# Patient Record
Sex: Female | Born: 1965 | Race: White | Hispanic: No | Marital: Married | Smoking: Former smoker
Health system: Southern US, Community
[De-identification: ages and names within clinical notes are randomized; demographics above are authoritative.]

## PROBLEM LIST (undated history)

## (undated) DIAGNOSIS — E785 Hyperlipidemia, unspecified: Secondary | ICD-10-CM

## (undated) DIAGNOSIS — N809 Endometriosis, unspecified: Secondary | ICD-10-CM

## (undated) DIAGNOSIS — Z8489 Family history of other specified conditions: Secondary | ICD-10-CM

## (undated) DIAGNOSIS — G4730 Sleep apnea, unspecified: Secondary | ICD-10-CM

## (undated) DIAGNOSIS — I480 Paroxysmal atrial fibrillation: Secondary | ICD-10-CM

## (undated) DIAGNOSIS — R7303 Prediabetes: Secondary | ICD-10-CM

## (undated) HISTORY — DX: Sleep apnea, unspecified: G47.30

## (undated) HISTORY — PX: EXPLORATORY LAPAROTOMY: SUR591

## (undated) HISTORY — PX: ACHILLES TENDON SURGERY: SHX542

## (undated) HISTORY — PX: APPENDECTOMY: SHX54

## (undated) HISTORY — PX: ROTATOR CUFF REPAIR: SHX139

## (undated) HISTORY — DX: Prediabetes: R73.03

## (undated) HISTORY — PX: CHOLECYSTECTOMY: SHX55

## (undated) HISTORY — DX: Hyperlipidemia, unspecified: E78.5

## (undated) HISTORY — DX: Paroxysmal atrial fibrillation: I48.0

## (undated) HISTORY — DX: Endometriosis, unspecified: N80.9

---

## 1989-01-27 LAB — HM HEPATITIS C SCREENING LAB: HM Hepatitis Screen: NEGATIVE

## 2014-09-06 LAB — RESULTS CONSOLE HPV: CHL HPV: NEGATIVE

## 2014-09-06 LAB — HM PAP SMEAR: HM Pap smear: NORMAL

## 2016-07-16 DIAGNOSIS — N6452 Nipple discharge: Secondary | ICD-10-CM | POA: Diagnosis not present

## 2016-10-24 DIAGNOSIS — N61 Mastitis without abscess: Secondary | ICD-10-CM | POA: Diagnosis not present

## 2016-11-03 DIAGNOSIS — N61 Mastitis without abscess: Secondary | ICD-10-CM | POA: Diagnosis not present

## 2016-12-23 DIAGNOSIS — Z85828 Personal history of other malignant neoplasm of skin: Secondary | ICD-10-CM | POA: Diagnosis not present

## 2016-12-23 DIAGNOSIS — L720 Epidermal cyst: Secondary | ICD-10-CM | POA: Diagnosis not present

## 2016-12-23 DIAGNOSIS — Z08 Encounter for follow-up examination after completed treatment for malignant neoplasm: Secondary | ICD-10-CM | POA: Diagnosis not present

## 2016-12-23 DIAGNOSIS — D485 Neoplasm of uncertain behavior of skin: Secondary | ICD-10-CM | POA: Diagnosis not present

## 2016-12-23 DIAGNOSIS — C44519 Basal cell carcinoma of skin of other part of trunk: Secondary | ICD-10-CM | POA: Diagnosis not present

## 2017-01-14 DIAGNOSIS — Z1231 Encounter for screening mammogram for malignant neoplasm of breast: Secondary | ICD-10-CM | POA: Diagnosis not present

## 2017-01-22 DIAGNOSIS — C44519 Basal cell carcinoma of skin of other part of trunk: Secondary | ICD-10-CM | POA: Diagnosis not present

## 2017-01-22 DIAGNOSIS — L910 Hypertrophic scar: Secondary | ICD-10-CM | POA: Diagnosis not present

## 2017-01-22 DIAGNOSIS — D485 Neoplasm of uncertain behavior of skin: Secondary | ICD-10-CM | POA: Diagnosis not present

## 2017-01-22 DIAGNOSIS — Z85828 Personal history of other malignant neoplasm of skin: Secondary | ICD-10-CM | POA: Diagnosis not present

## 2017-01-22 DIAGNOSIS — Z08 Encounter for follow-up examination after completed treatment for malignant neoplasm: Secondary | ICD-10-CM | POA: Diagnosis not present

## 2017-03-17 DIAGNOSIS — L988 Other specified disorders of the skin and subcutaneous tissue: Secondary | ICD-10-CM | POA: Diagnosis not present

## 2017-03-17 DIAGNOSIS — C44519 Basal cell carcinoma of skin of other part of trunk: Secondary | ICD-10-CM | POA: Diagnosis not present

## 2017-03-17 DIAGNOSIS — L905 Scar conditions and fibrosis of skin: Secondary | ICD-10-CM | POA: Diagnosis not present

## 2017-05-05 DIAGNOSIS — Z08 Encounter for follow-up examination after completed treatment for malignant neoplasm: Secondary | ICD-10-CM | POA: Diagnosis not present

## 2017-05-05 DIAGNOSIS — Z85828 Personal history of other malignant neoplasm of skin: Secondary | ICD-10-CM | POA: Diagnosis not present

## 2017-05-05 DIAGNOSIS — L570 Actinic keratosis: Secondary | ICD-10-CM | POA: Diagnosis not present

## 2017-07-20 DIAGNOSIS — Z13220 Encounter for screening for lipoid disorders: Secondary | ICD-10-CM | POA: Diagnosis not present

## 2017-07-20 DIAGNOSIS — Z0000 Encounter for general adult medical examination without abnormal findings: Secondary | ICD-10-CM | POA: Diagnosis not present

## 2017-11-10 DIAGNOSIS — R109 Unspecified abdominal pain: Secondary | ICD-10-CM | POA: Diagnosis not present

## 2017-12-22 DIAGNOSIS — Z08 Encounter for follow-up examination after completed treatment for malignant neoplasm: Secondary | ICD-10-CM | POA: Diagnosis not present

## 2017-12-22 DIAGNOSIS — Z85828 Personal history of other malignant neoplasm of skin: Secondary | ICD-10-CM | POA: Diagnosis not present

## 2018-01-21 DIAGNOSIS — Z1231 Encounter for screening mammogram for malignant neoplasm of breast: Secondary | ICD-10-CM | POA: Diagnosis not present

## 2018-10-05 DIAGNOSIS — Z08 Encounter for follow-up examination after completed treatment for malignant neoplasm: Secondary | ICD-10-CM | POA: Diagnosis not present

## 2018-10-05 DIAGNOSIS — Z85828 Personal history of other malignant neoplasm of skin: Secondary | ICD-10-CM | POA: Diagnosis not present

## 2018-10-05 DIAGNOSIS — C44519 Basal cell carcinoma of skin of other part of trunk: Secondary | ICD-10-CM | POA: Diagnosis not present

## 2018-10-05 DIAGNOSIS — D485 Neoplasm of uncertain behavior of skin: Secondary | ICD-10-CM | POA: Diagnosis not present

## 2018-11-02 DIAGNOSIS — C44519 Basal cell carcinoma of skin of other part of trunk: Secondary | ICD-10-CM | POA: Diagnosis not present

## 2019-04-05 DIAGNOSIS — D485 Neoplasm of uncertain behavior of skin: Secondary | ICD-10-CM | POA: Diagnosis not present

## 2019-04-05 DIAGNOSIS — L578 Other skin changes due to chronic exposure to nonionizing radiation: Secondary | ICD-10-CM | POA: Diagnosis not present

## 2019-04-05 DIAGNOSIS — L820 Inflamed seborrheic keratosis: Secondary | ICD-10-CM | POA: Diagnosis not present

## 2019-04-05 DIAGNOSIS — Z85828 Personal history of other malignant neoplasm of skin: Secondary | ICD-10-CM | POA: Diagnosis not present

## 2019-04-05 DIAGNOSIS — C44519 Basal cell carcinoma of skin of other part of trunk: Secondary | ICD-10-CM | POA: Diagnosis not present

## 2019-04-05 DIAGNOSIS — Z08 Encounter for follow-up examination after completed treatment for malignant neoplasm: Secondary | ICD-10-CM | POA: Diagnosis not present

## 2019-04-20 DIAGNOSIS — M25642 Stiffness of left hand, not elsewhere classified: Secondary | ICD-10-CM | POA: Diagnosis not present

## 2019-04-26 DIAGNOSIS — L905 Scar conditions and fibrosis of skin: Secondary | ICD-10-CM | POA: Diagnosis not present

## 2019-04-26 DIAGNOSIS — C44519 Basal cell carcinoma of skin of other part of trunk: Secondary | ICD-10-CM | POA: Diagnosis not present

## 2019-10-04 DIAGNOSIS — Z08 Encounter for follow-up examination after completed treatment for malignant neoplasm: Secondary | ICD-10-CM | POA: Diagnosis not present

## 2019-10-04 DIAGNOSIS — Z85828 Personal history of other malignant neoplasm of skin: Secondary | ICD-10-CM | POA: Diagnosis not present

## 2019-10-04 DIAGNOSIS — L219 Seborrheic dermatitis, unspecified: Secondary | ICD-10-CM | POA: Diagnosis not present

## 2019-10-04 DIAGNOSIS — L570 Actinic keratosis: Secondary | ICD-10-CM | POA: Diagnosis not present

## 2019-10-04 DIAGNOSIS — L578 Other skin changes due to chronic exposure to nonionizing radiation: Secondary | ICD-10-CM | POA: Diagnosis not present

## 2019-12-13 ENCOUNTER — Ambulatory Visit (INDEPENDENT_AMBULATORY_CARE_PROVIDER_SITE_OTHER): Payer: 59

## 2019-12-13 ENCOUNTER — Other Ambulatory Visit: Payer: Self-pay

## 2019-12-13 ENCOUNTER — Ambulatory Visit (HOSPITAL_COMMUNITY)
Admission: EM | Admit: 2019-12-13 | Discharge: 2019-12-13 | Disposition: A | Discharge status: Home / Self Care | Payer: 59 | Attending: Family Medicine | Admitting: Family Medicine

## 2019-12-13 ENCOUNTER — Encounter (HOSPITAL_COMMUNITY): Payer: Self-pay | Admitting: Emergency Medicine

## 2019-12-13 DIAGNOSIS — M79671 Pain in right foot: Secondary | ICD-10-CM

## 2019-12-13 DIAGNOSIS — S99921A Unspecified injury of right foot, initial encounter: Secondary | ICD-10-CM | POA: Diagnosis not present

## 2019-12-13 NOTE — ED Triage Notes (Signed)
patient reports a fall today.  Patient fell on step, twisted right foot.  Fell down 3 steps.  Left elbow pain, and left ankle abrasion.  Right foot with pain, swelling and bruise to right foot.  Right pedal pulse 2+. Patient is able to move toes on right foot.  Patient has an ice pack to right foot

## 2019-12-13 NOTE — Discharge Instructions (Addendum)
If not allergic, you may use over the counter ibuprofen or acetaminophen as needed. ° °

## 2019-12-14 NOTE — ED Provider Notes (Signed)
[place]   [id number] 12/13/19 Arrival Time: [place]:  1. Right foot pain     I have personally viewed the imaging studies ordered this visit. Questionable prox 5th metatarsal fx.  Orders Placed This Encounter  Procedures   DG Foot Complete Right   Apply CAM boot   WBAT.  Recommend:  Follow-up Information    Schedule an appointment as soon as possible for a visit  with [facility].   Why: [address] [place], [place] [address]  [phone]             Prefers OTC ibuprofen/Tylenol as needed.  Reviewed expectations re: course of current medical issues. Questions answered. Outlined signs and symptoms indicating need for more acute intervention. Patient verbalized understanding. After Visit Summary given.  SUBJECTIVE: History from: patient. Nicole Anderson is a 54 y.o. female who reports persistent marked pain of her right dorsal and lateral foot; described as aching; without radiation. Onset: abrupt. First noted: today. Injury/trama: reports falling on stairs with immediate pain to foot. Symptoms have progressed to a point and plateaued since beginning. Aggravating factors: certain movements and weight bearing. Alleviating factors: rest. Associated symptoms: none reported. Extremity sensation changes or weakness: none. Self treatment: none yet.  History of similar: no.  Past Surgical History:  Procedure Laterality Date   ACHILLES TENDON SURGERY Bilateral    APPENDECTOMY     CHOLECYSTECTOMY     ROTATOR CUFF REPAIR        OBJECTIVE:  Vitals:   12/13/19 1521  BP: (!) 144/69  Pulse: 66  Resp: (!) 21  Temp: 98.7 F (37.1 C)  TempSrc: Oral  SpO2: 95%    General appearance: alert; no distress HEENT: [place]; AT Neck: supple with FROM Resp: unlabored respirations Extremities:  RLE: warm with well perfused appearance; fairly well localized moderate tenderness over right dorsal and  lateral foot; is tender over prox 5th metatarsal; without gross deformities; swelling: moderate; bruising: minimal; ankle ROM: normal CV: brisk extremity capillary refill of RLE; 2+ DP pulse of RLE. Skin: warm and dry; no visible rashes Neurologic: normal sensation and strength of RLE; moves toes normally Psychological: alert and cooperative; normal mood and affect  Imaging: DG Foot Complete Right  Result Date: 12/13/2019 CLINICAL DATA:  Right foot pain after twisting injury. Second through fifth metatarsals. Dorsal pain. EXAM: RIGHT FOOT COMPLETE - 3+ VIEW COMPARISON:  None. FINDINGS: Irregular lucency at the base of the fifth metatarsal. Degenerative changes of the first metatarsophalangeal joint. IMPRESSION: Somewhat irregular lucency at the base of the fifth metatarsal. Given clinical history, presumably indicative of acute/subacute fracture. Correlate with point tenderness, as remote non healed fracture could look similar. Electronically Signed   By: Abigail Miyamoto M.D.   On: 12/13/2019 16:46      Allergies  Allergen Reactions   Ketorolac Tromethamine Rash   Nitrofurantoin Rash   Dilaudid [Hydromorphone]     History reviewed. No pertinent past medical history. Social History   Socioeconomic History   Marital status: Married    Spouse name: Not on file   Number of children: Not on file   Years of education: Not on file   Highest education level: Not on file  Occupational History   Not on file  Tobacco Use   Smoking status: Never Smoker  Substance and Sexual Activity   Alcohol use: Never   Drug use: Never   Sexual activity: Not on file  Other Topics Concern  Not on file  Social History Narrative   Not on file   Social Determinants of Health   Financial Resource Strain:    Difficulty of Paying Living Expenses: Not on file  Food Insecurity:    Worried About [place] in the Last Year: Not on file   Ran Out of Food in the Last Year: Not on  file  Transportation Needs:    Lack of Transportation (Medical): Not on file   Lack of Transportation (Non-Medical): Not on file  Physical Activity:    Days of Exercise per Week: Not on file   Minutes of Exercise per Session: Not on file  Stress:    Feeling of Stress : Not on file  Social Connections:    Frequency of Communication with Friends and Family: Not on file   Frequency of Social Gatherings with Friends and Family: Not on file   Attends Religious Services: Not on file   Active Member of Clubs or Organizations: Not on file   Attends Archivist Meetings: Not on file   Marital Status: Not on file   No family history on file. Past Surgical History:  Procedure Laterality Date   ACHILLES TENDON SURGERY Bilateral    APPENDECTOMY     CHOLECYSTECTOMY     ROTATOR CUFF REPAIR        Vanessa Kick, MD 12/14/19 [phone]

## 2020-02-15 ENCOUNTER — Other Ambulatory Visit: Payer: Self-pay | Admitting: Orthopaedic Surgery

## 2020-02-15 ENCOUNTER — Other Ambulatory Visit (HOSPITAL_COMMUNITY): Payer: Self-pay

## 2020-02-15 ENCOUNTER — Other Ambulatory Visit: Payer: Self-pay

## 2020-02-15 DIAGNOSIS — M79671 Pain in right foot: Secondary | ICD-10-CM

## 2020-02-22 ENCOUNTER — Other Ambulatory Visit: Payer: Self-pay

## 2020-02-22 ENCOUNTER — Ambulatory Visit (HOSPITAL_COMMUNITY)
Admission: RE | Admit: 2020-02-22 | Discharge: 2020-02-22 | Disposition: A | Discharge status: Home / Self Care | Payer: PRIVATE HEALTH INSURANCE | Source: Ambulatory Visit | Attending: Orthopaedic Surgery | Admitting: Orthopaedic Surgery

## 2020-02-22 DIAGNOSIS — M79671 Pain in right foot: Secondary | ICD-10-CM | POA: Insufficient documentation

## 2020-03-15 ENCOUNTER — Other Ambulatory Visit: Payer: Self-pay | Admitting: Orthopaedic Surgery

## 2020-03-22 ENCOUNTER — Encounter (HOSPITAL_BASED_OUTPATIENT_CLINIC_OR_DEPARTMENT_OTHER): Payer: Self-pay | Admitting: Orthopaedic Surgery

## 2020-03-22 ENCOUNTER — Other Ambulatory Visit: Payer: Self-pay

## 2020-03-26 ENCOUNTER — Other Ambulatory Visit (HOSPITAL_COMMUNITY)
Admission: RE | Admit: 2020-03-26 | Discharge: 2020-03-26 | Disposition: A | Discharge status: Home / Self Care | Payer: 59 | Source: Ambulatory Visit | Attending: Orthopaedic Surgery | Admitting: Orthopaedic Surgery

## 2020-03-26 DIAGNOSIS — Z20822 Contact with and (suspected) exposure to covid-19: Secondary | ICD-10-CM | POA: Insufficient documentation

## 2020-03-26 DIAGNOSIS — Z01812 Encounter for preprocedural laboratory examination: Secondary | ICD-10-CM | POA: Diagnosis not present

## 2020-03-26 NOTE — Progress Notes (Signed)

## 2020-03-27 LAB — SARS CORONAVIRUS 2 (TAT 6-24 HRS): SARS Coronavirus 2: NEGATIVE

## 2020-03-29 ENCOUNTER — Ambulatory Visit (HOSPITAL_BASED_OUTPATIENT_CLINIC_OR_DEPARTMENT_OTHER)
Admission: RE | Admit: 2020-03-29 | Discharge: 2020-03-29 | Disposition: A | Discharge status: Home / Self Care | Payer: PRIVATE HEALTH INSURANCE | Attending: Orthopaedic Surgery | Admitting: Orthopaedic Surgery

## 2020-03-29 ENCOUNTER — Ambulatory Visit (HOSPITAL_BASED_OUTPATIENT_CLINIC_OR_DEPARTMENT_OTHER): Payer: PRIVATE HEALTH INSURANCE | Admitting: Certified Registered"

## 2020-03-29 ENCOUNTER — Encounter (HOSPITAL_BASED_OUTPATIENT_CLINIC_OR_DEPARTMENT_OTHER): Payer: Self-pay | Admitting: Orthopaedic Surgery

## 2020-03-29 ENCOUNTER — Encounter (HOSPITAL_BASED_OUTPATIENT_CLINIC_OR_DEPARTMENT_OTHER)
Admission: RE | Disposition: A | Discharge status: Home / Self Care | Payer: Self-pay | Source: Home / Self Care | Attending: Orthopaedic Surgery

## 2020-03-29 ENCOUNTER — Other Ambulatory Visit: Payer: Self-pay

## 2020-03-29 DIAGNOSIS — M67873 Other specified disorders of tendon, right ankle and foot: Secondary | ICD-10-CM | POA: Insufficient documentation

## 2020-03-29 DIAGNOSIS — Z793 Long term (current) use of hormonal contraceptives: Secondary | ICD-10-CM | POA: Insufficient documentation

## 2020-03-29 DIAGNOSIS — X58XXXA Exposure to other specified factors, initial encounter: Secondary | ICD-10-CM | POA: Diagnosis not present

## 2020-03-29 DIAGNOSIS — S92351A Displaced fracture of fifth metatarsal bone, right foot, initial encounter for closed fracture: Secondary | ICD-10-CM | POA: Diagnosis not present

## 2020-03-29 DIAGNOSIS — Z87891 Personal history of nicotine dependence: Secondary | ICD-10-CM | POA: Diagnosis not present

## 2020-03-29 DIAGNOSIS — S92351K Displaced fracture of fifth metatarsal bone, right foot, subsequent encounter for fracture with nonunion: Secondary | ICD-10-CM | POA: Diagnosis present

## 2020-03-29 HISTORY — DX: Family history of other specified conditions: Z84.89

## 2020-03-29 HISTORY — PX: REPAIR EXTENSOR TENDON WITH METATARSAL OSTEOTOMY AND OPEN REDUCTION IN: SHX5698

## 2020-03-29 SURGERY — REPAIR EXTENSOR TENDON WITH METATARSAL OSTEOTOMY AND OPEN REDUCTION INTERNAL FIXATION (ORIF) METATARSAL
Anesthesia: General | Site: Foot | Laterality: Right

## 2020-03-29 MED ORDER — FENTANYL CITRATE (PF) 100 MCG/2ML IJ SOLN
INTRAMUSCULAR | Status: AC
  Filled 2020-03-29: qty 2

## 2020-03-29 MED ORDER — AMISULPRIDE (ANTIEMETIC) 5 MG/2ML IV SOLN: 10.0000 mg | Freq: Once | INTRAVENOUS | Status: DC | PRN

## 2020-03-29 MED ORDER — PROPOFOL 10 MG/ML IV BOLUS
INTRAVENOUS | Status: AC
  Filled 2020-03-29: qty 20

## 2020-03-29 MED ORDER — EPHEDRINE SULFATE 50 MG/ML IJ SOLN
INTRAMUSCULAR | Status: DC | PRN
  Administered 2020-03-29: 10 mg via INTRAVENOUS

## 2020-03-29 MED ORDER — MIDAZOLAM HCL 2 MG/2ML IJ SOLN
INTRAMUSCULAR | Status: AC
  Filled 2020-03-29: qty 2

## 2020-03-29 MED ORDER — ACETAMINOPHEN 500 MG PO TABS: 1000.0000 mg | ORAL_TABLET | Freq: Once | ORAL | Status: DC

## 2020-03-29 MED ORDER — CEFAZOLIN SODIUM-DEXTROSE 2-4 GM/100ML-% IV SOLN
INTRAVENOUS | Status: AC
  Filled 2020-03-29: qty 100

## 2020-03-29 MED ORDER — CEFAZOLIN SODIUM-DEXTROSE 1-4 GM/50ML-% IV SOLN
INTRAVENOUS | Status: AC
  Filled 2020-03-29: qty 50

## 2020-03-29 MED ORDER — PROPOFOL 10 MG/ML IV BOLUS
INTRAVENOUS | Status: DC | PRN
  Administered 2020-03-29: 100 mg via INTRAVENOUS
  Administered 2020-03-29: 200 mg via INTRAVENOUS

## 2020-03-29 MED ORDER — 0.9 % SODIUM CHLORIDE (POUR BTL) OPTIME
TOPICAL | Status: DC | PRN
  Administered 2020-03-29: 1000 mL

## 2020-03-29 MED ORDER — BUPIVACAINE-EPINEPHRINE (PF) 0.5% -1:200000 IJ SOLN
INTRAMUSCULAR | Status: DC | PRN
  Administered 2020-03-29: 30 mL via PERINEURAL

## 2020-03-29 MED ORDER — ONDANSETRON HCL 4 MG/2ML IJ SOLN
INTRAMUSCULAR | Status: DC | PRN
Start: 2020-03-29 — End: 2020-03-29
  Administered 2020-03-29: 4 mg via INTRAVENOUS

## 2020-03-29 MED ORDER — EPHEDRINE 5 MG/ML INJ
INTRAVENOUS | Status: AC
  Filled 2020-03-29: qty 10

## 2020-03-29 MED ORDER — CEFAZOLIN SODIUM-DEXTROSE 2-4 GM/100ML-% IV SOLN
2.0000 g | INTRAVENOUS | Status: AC
  Administered 2020-03-29: 3 g via INTRAVENOUS

## 2020-03-29 MED ORDER — MIDAZOLAM HCL 2 MG/2ML IJ SOLN
2.0000 mg | Freq: Once | INTRAMUSCULAR | Status: AC
  Administered 2020-03-29: 2 mg via INTRAVENOUS

## 2020-03-29 MED ORDER — PROPOFOL 500 MG/50ML IV EMUL
INTRAVENOUS | Status: AC
  Filled 2020-03-29: qty 50

## 2020-03-29 MED ORDER — FENTANYL CITRATE (PF) 100 MCG/2ML IJ SOLN: 25.0000 ug | INTRAMUSCULAR | Status: DC | PRN

## 2020-03-29 MED ORDER — FENTANYL CITRATE (PF) 100 MCG/2ML IJ SOLN
50.0000 ug | Freq: Once | INTRAMUSCULAR | Status: AC
Start: 2020-03-29 — End: 2020-03-29
  Administered 2020-03-29: 50 ug via INTRAVENOUS

## 2020-03-29 MED ORDER — LACTATED RINGERS IV SOLN: INTRAVENOUS | Status: DC

## 2020-03-29 MED ORDER — DEXAMETHASONE SODIUM PHOSPHATE 4 MG/ML IJ SOLN
INTRAMUSCULAR | Status: DC | PRN
  Administered 2020-03-29: 10 mg via INTRAVENOUS

## 2020-03-29 MED ORDER — OXYCODONE HCL 5 MG PO TABS: 5.0000 mg | ORAL_TABLET | ORAL | 0 refills | Status: AC | PRN

## 2020-03-29 SURGICAL SUPPLY — 72 items
BANDAGE ESMARK 6X9 LF (GAUZE/BANDAGES/DRESSINGS) IMPLANT
BENZOIN TINCTURE PRP APPL 2/3 (GAUZE/BANDAGES/DRESSINGS) IMPLANT
BIT DRILL 1.7 (BIT) ×1
BIT DRILL 1.7X (BIT) ×1 IMPLANT
BLADE SURG 15 STRL LF DISP TIS (BLADE) ×3 IMPLANT
BLADE SURG 15 STRL SS (BLADE) ×3
BNDG COHESIVE 4X5 TAN STRL (GAUZE/BANDAGES/DRESSINGS) IMPLANT
BNDG ELASTIC 4X5.8 VLCR STR LF (GAUZE/BANDAGES/DRESSINGS) IMPLANT
BNDG ELASTIC 6X5.8 VLCR STR LF (GAUZE/BANDAGES/DRESSINGS) ×4 IMPLANT
BNDG ESMARK 4X9 LF (GAUZE/BANDAGES/DRESSINGS) ×2 IMPLANT
BNDG ESMARK 6X9 LF (GAUZE/BANDAGES/DRESSINGS)
CHLORAPREP W/TINT 26 (MISCELLANEOUS) ×2 IMPLANT
COVER BACK TABLE 60X90IN (DRAPES) ×2 IMPLANT
COVER WAND RF STERILE (DRAPES) IMPLANT
CUFF TOURN SGL QUICK 34 (TOURNIQUET CUFF) ×1
CUFF TRNQT CYL 34X4.125X (TOURNIQUET CUFF) ×1 IMPLANT
DECANTER SPIKE VIAL GLASS SM (MISCELLANEOUS) IMPLANT
DRAPE C-ARMOR (DRAPES) IMPLANT
DRAPE EXTREMITY T 121X128X90 (DISPOSABLE) ×2 IMPLANT
DRAPE IMP U-DRAPE 54X76 (DRAPES) ×2 IMPLANT
DRAPE OEC MINIVIEW 54X84 (DRAPES) ×2 IMPLANT
DRAPE U-SHAPE 47X51 STRL (DRAPES) ×2 IMPLANT
ELECT REM PT RETURN 9FT ADLT (ELECTROSURGICAL) ×2
ELECTRODE REM PT RTRN 9FT ADLT (ELECTROSURGICAL) ×1 IMPLANT
GAUZE SPONGE 4X4 12PLY STRL (GAUZE/BANDAGES/DRESSINGS) ×2 IMPLANT
GAUZE XEROFORM 1X8 LF (GAUZE/BANDAGES/DRESSINGS) ×2 IMPLANT
GLOVE SRG 8 PF TXTR STRL LF DI (GLOVE) ×1 IMPLANT
GLOVE SURG ENC MOIS LTX SZ6.5 (GLOVE) ×2 IMPLANT
GLOVE SURG ENC TEXT LTX SZ7.5 (GLOVE) ×2 IMPLANT
GLOVE SURG UNDER POLY LF SZ8 (GLOVE) ×1
GOWN STRL REUS W/ TWL LRG LVL3 (GOWN DISPOSABLE) ×2 IMPLANT
GOWN STRL REUS W/ TWL XL LVL3 (GOWN DISPOSABLE) ×2 IMPLANT
GOWN STRL REUS W/TWL LRG LVL3 (GOWN DISPOSABLE) ×2
GOWN STRL REUS W/TWL XL LVL3 (GOWN DISPOSABLE) ×2
GUIDEWIRE .045XTROC TIP LSR LN (WIRE) ×1 IMPLANT
GUIDEWIRE TROCAR 1.6X71 (ORTHOPEDIC DISPOSABLE SUPPLIES) ×2 IMPLANT
K-WIRE 1.1 (WIRE) ×1
KIT SUTURETAK 2.4 DRILL BIT (KITS) ×2 IMPLANT
NDL SAFETY ECLIPSE 18X1.5 (NEEDLE) IMPLANT
NEEDLE HYPO 18GX1.5 SHARP (NEEDLE)
NS IRRIG 1000ML POUR BTL (IV SOLUTION) ×2 IMPLANT
PACK BASIN DAY SURGERY FS (CUSTOM PROCEDURE TRAY) ×2 IMPLANT
PAD CAST 4YDX4 CTTN HI CHSV (CAST SUPPLIES) ×1 IMPLANT
PADDING CAST COTTON 4X4 STRL (CAST SUPPLIES) ×1
PADDING CAST SYNTHETIC 4 (CAST SUPPLIES) ×4
PADDING CAST SYNTHETIC 4X4 STR (CAST SUPPLIES) ×4 IMPLANT
PENCIL SMOKE EVACUATOR (MISCELLANEOUS) ×2 IMPLANT
PUTTY DBM ALLOSYNC PURE 5CC (Putty) ×2 IMPLANT
SCREW CORT LP 2.4X18 (Screw) ×2 IMPLANT
SHEET MEDIUM DRAPE 40X70 STRL (DRAPES) ×2 IMPLANT
SLEEVE SCD COMPRESS KNEE MED (STOCKING) ×2 IMPLANT
SPLINT FIBERGLASS 4X30 (CAST SUPPLIES) IMPLANT
SPONGE LAP 18X18 RF (DISPOSABLE) IMPLANT
STOCKINETTE 6  STRL (DRAPES) ×1
STOCKINETTE 6 STRL (DRAPES) ×1 IMPLANT
STRIP CLOSURE SKIN 1/2X4 (GAUZE/BANDAGES/DRESSINGS) IMPLANT
SUCTION FRAZIER HANDLE 10FR (MISCELLANEOUS) ×1
SUCTION TUBE FRAZIER 10FR DISP (MISCELLANEOUS) ×1 IMPLANT
SUT ETHILON 3 0 PS 1 (SUTURE) ×2 IMPLANT
SUT FIBERWIRE 2-0 18 17.9 3/8 (SUTURE)
SUT MNCRL AB 3-0 PS2 18 (SUTURE) ×2 IMPLANT
SUT PDS AB 2-0 CT2 27 (SUTURE) ×2 IMPLANT
SUT VIC AB 2-0 SH 27 (SUTURE) ×1
SUT VIC AB 2-0 SH 27XBRD (SUTURE) ×1 IMPLANT
SUT VIC AB 3-0 FS2 27 (SUTURE) IMPLANT
SUTURE FIBERWR 2-0 18 17.9 3/8 (SUTURE) IMPLANT
SUTURE TAPE 3.0 DBL LOAD S-TAK (Anchor) ×1 IMPLANT
SUTURETAPE 3.0 DBL LOAD S-TAK (Anchor) ×2 IMPLANT
SYR 10ML LL (SYRINGE) ×2 IMPLANT
SYR BULB EAR ULCER 3OZ GRN STR (SYRINGE) ×2 IMPLANT
TOWEL GREEN STERILE FF (TOWEL DISPOSABLE) ×4 IMPLANT
TUBE CONNECTING 20X1/4 (TUBING) ×2 IMPLANT

## 2020-03-29 NOTE — Anesthesia Postprocedure Evaluation (Signed)
Anesthesia Post Note  Patient: ESTEEN DELPRIORE  Procedure(s) Performed: OPEN TREATMENT OF RIGHT FIFTH METATARSAL WITH REPAIR OF NONUNION, PERONEUS BREVIS TENOLYSIS (Right Foot)     Patient location during evaluation: PACU Anesthesia Type: General Level of consciousness: awake and alert and oriented Pain management: pain level controlled Vital Signs Assessment: post-procedure vital signs reviewed and stable Respiratory status: spontaneous breathing, nonlabored ventilation and respiratory function stable Cardiovascular status: blood pressure returned to baseline Postop Assessment: no apparent nausea or vomiting Anesthetic complications: no   No complications documented.  Last Vitals:  Vitals:   03/29/20 1500 03/29/20 1501  BP: (!) 122/56   Pulse: 72 70  Resp: 11 10  Temp:    SpO2: 93% 96%    Last Pain:  Vitals:   03/29/20 1501  TempSrc:   PainSc: 0-No pain                 Brennan Bailey

## 2020-03-29 NOTE — Progress Notes (Signed)
Assisted Dr. Rodman Comp with right, ultrasound guided, popliteal block. Side rails up, monitors on throughout procedure. See vital signs in flow sheet. Tolerated Procedure well.

## 2020-03-29 NOTE — H&P (Signed)
PREOPERATIVE H&P  Chief Complaint: Right foot pain  HPI: Nicole Anderson is a 55 y.o. female who presents for preoperative history and physical with a diagnosis of right fifth metatarsal fracture with nonunion.  Patient sustained her fracture in early November 2021 and had continued pain and swelling despite conservative treatment.  X-rays revealed persistent lucency consistent with delayed union.  She had continued pain that was not improving.  She is here today for surgical correction in the form of open treatment versus excision and tendon transfer. Symptoms are rated as moderate to severe, and have been worsening.  This is significantly impairing activities of daily living.  She has elected for surgical management.   Past Medical History:  Diagnosis Date  . Family history of adverse reaction to anesthesia    mom with complications   Past Surgical History:  Procedure Laterality Date  . ACHILLES TENDON SURGERY Bilateral   . APPENDECTOMY    . CHOLECYSTECTOMY    . ROTATOR CUFF REPAIR     Social History   Socioeconomic History  . Marital status: Married    Spouse name: Not on file  . Number of children: Not on file  . Years of education: Not on file  . Highest education level: Not on file  Occupational History  . Not on file  Tobacco Use  . Smoking status: Former Research scientist (life sciences)  . Smokeless tobacco: Never Used  Substance and Sexual Activity  . Alcohol use: Never  . Drug use: Never  . Sexual activity: Not on file  Other Topics Concern  . Not on file  Social History Narrative  . Not on file   Social Determinants of Health   Financial Resource Strain: Not on file  Food Insecurity: Not on file  Transportation Needs: Not on file  Physical Activity: Not on file  Stress: Not on file  Social Connections: Not on file   History reviewed. No pertinent family history. Allergies  Allergen Reactions  . Ketorolac Tromethamine Rash  . Nitrofurantoin Rash  . Dilaudid [Hydromorphone]  Itching   Prior to Admission medications   Medication Sig Start Date End Date Taking? Authorizing Provider  levonorgestrel (MIRENA) 20 MCG/24HR IUD by Intrauterine route.   Yes [provider]     Positive ROS: All other systems have been reviewed and were otherwise negative with the exception of those mentioned in the HPI and as above.  Physical Exam:  Vitals:   03/29/20 1135 03/29/20 1139  BP:    Pulse: 68 76  Resp: 14 15  Temp:    SpO2: 99% 99%   General: Alert, no acute distress Cardiovascular: No pedal edema Respiratory: No cyanosis, no use of accessory musculature GI: No organomegaly, abdomen is soft and non-tender Skin: No lesions in the area of chief complaint Neurologic: Sensation intact distally Psychiatric: Patient is competent for consent with normal mood and affect Lymphatic: No axillary or cervical lymphadenopathy  MUSCULOSKELETAL: Right foot demonstrates swelling and tenderness laterally at the base of the fifth metatarsal.  She has active hindfoot eversion but pain against resistance.  She has no tenderness proximally along the peroneal tendons.  Sensation grossly intact distally.  Foot is warm and well-perfused.  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  She may require debridement of her peroneal tendons and advancement if the bony is excised.  She will be nonweightbearing postoperatively in a splint..  We discussed the risks, benefits and alternatives of surgery which include but are not limited to wound healing complications, infection, nonunion, malunion, need for further surgery, damage to surrounding structures and continued pain.  They understand there is no guarantees to an acceptable outcome.  After weighing these risks they opted to  proceed with surgery.     Erle Crocker, MD    03/29/2020 12:11 PM

## 2020-03-29 NOTE — Anesthesia Preprocedure Evaluation (Addendum)
Anesthesia Evaluation  Patient identified by MRN, date of birth, ID band Patient awake    Reviewed: Allergy & Precautions, NPO status , Patient's Chart, lab work & pertinent test results  Airway Mallampati: II  TM Distance: >3 FB Neck ROM: Full    Dental  (+) Dental Advisory Given   Pulmonary former smoker,    breath sounds clear to auscultation       Cardiovascular negative cardio ROS   Rhythm:Regular Rate:Normal     Neuro/Psych negative neurological ROS     GI/Hepatic negative GI ROS, Neg liver ROS,   Endo/Other  Morbid obesity  Renal/GU negative Renal ROS     Musculoskeletal   Abdominal   Peds  Hematology negative hematology ROS (+)   Anesthesia Other Findings   Reproductive/Obstetrics                            Anesthesia Physical Anesthesia Plan  ASA: III  Anesthesia Plan: General   Post-op Pain Management:  Regional for Post-op pain   Induction:   PONV Risk Score and Plan: 3 and Ondansetron, Dexamethasone, Treatment may vary due to age or medical condition and Midazolam  Airway Management Planned: LMA  Additional Equipment:   Intra-op Plan:   Post-operative Plan: Extubation in OR  Informed Consent: I have reviewed the patients History and Physical, chart, labs and discussed the procedure including the risks, benefits and alternatives for the proposed anesthesia with the patient or authorized representative who has indicated his/her understanding and acceptance.     Dental advisory given  Plan Discussed with: CRNA  Anesthesia Plan Comments:         Anesthesia Quick Evaluation

## 2020-03-29 NOTE — Op Note (Signed)
Nicole Anderson female 55 y.o. 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  Significant amount of fibrous fracture callus.  Tenosynovitis and adhesions of the peroneus brevis tendon distally  IMPLANTS: Arthrex suture tack  INDICATIONS:54 y.o. female sustained 1/5 metatarsal base fracture 4 months ago.  It was mildly displaced but went on to nonunion.  She had continued pain in the area of the fracture and CT scan demonstrated no healing.  Given her continued pain and failure of conservative treatment she was indicated for surgery.   Patient understood the risks, benefits and alternatives to surgery which include but are not limited to wound healing complications, infection, nonunion, malunion, need for further surgery as well as damage to surrounding structures. They also understood the potential for continued pain in that there were no guarantees of acceptable outcome After weighing these risks the patient opted to proceed with surgery.  PROCEDURE: Patient was identified in the preoperative holding area.  The right leg was marked by myself.  Consent was signed by myself and the patient.  Block was performed by anesthesia in the preoperative holding area.  Patient was taken to the operative suite and placed supine on the operative table.  General LMA anesthesia was induced without difficulty. Bump was placed under the operative hip and bone foam was used.  All bony  prominences were well padded.  Tourniquet was placed on the operative thigh.  Preoperative antibiotics were given. The extremity was prepped and draped in the usual sterile fashion and surgical timeout was performed.  The limb was elevated and the tourniquet was inflated to 250 mmHg.  Open treatment of fifth metatarsal fracture: We began by making a longitudinal incision overlying the fracture site of the fifth metatarsal base on the lateral foot.  This taken sharply down through skin and subcutaneous tissue.  Retinacular tissue was identified and incised in line with the incision.  The sural nerve was identified, mobilized and protected through the entirety of the case.  We then identified the fracture site and confirmed this on fluoroscopy.  There was a robust fibrous nonunion tissue in this area.  The fracture site was mobilized.  There was some bony healing medially but a large area ununited fracture fragment laterally.  Valora Corporal was used mobilize the fracture site.  There is a large fracture fragment proximally that was attached to the peroneus brevis tendon.  There is notable adhesions and synovitis of the distal aspect of the peroneus brevis tendon.  The peroneus tertius tendon was also attached to the fracture fragment.  The fracture fragment was mobilized further and the peroneus brevis and tertius tendons were mobilized with the fracture fragment.  The tendon was not detached.  Repair of metatarsal nonunion: Then using a rondure the fracture callus and fibrous tissue was removed from the fracture fragment as well as the distal aspect of the origin of the fracture fragment.  Then the curette was used to remove the fibrous tissue back to bleeding cancellous bone on both the proximal and distal fragments.  Tissue was further mobilized around the fracture.  Care was taken  to identify any further fracture fragments and to remove all of the fibrous fracture callus.  Bone graft in the form of allosync pure was  then placed at the fracture site.  Then the fracture fragment was reduced and held provisionally with K wire fixation.  Attempt was made for screw fixation however the remaining viable bone piece was too thin for hardware.  Therefore a suture tack that was doubly loaded with fiber tape was placed in the distal aspect of the metatarsal shaft at the appropriate reduction site for the fracture.  Then the 2 sutures were run through the bony portion of the displaced fragment and the peroneus brevis and peroneus tertius tendons.  The fracture was then held provisionally reduced with the foot in an everted position and the suture tied down.  The fracture fragment remained adequately reduced and stable.  Through gentle range of motion there was no gapping at the fracture site.  Fluoroscopy confirmed appropriate position of the fracture.  Debridement, excisional of peroneus brevis tenosynovitis We then turned our attention to the distal aspect of the peroneus brevis tendon.  There were adhesions in this area that were sharply debrided with a 15 blade, dissecting scissors and excised.  Approximately 2 cm length of the tendon was debrided.  The debrided tissue was discarded.  The tendon had good gliding after debridement within the distal portion of the sheath.  The wound was then irrigated copiously with normal saline.  Further areas of exposed fracture were packed with more bone graft.  Then using a 2-0 Vicryl suture the remaining insertion of peroneus brevis and tertius were oversewed to in case and surround the fracture fragment to the intact bone.  The wound was then irrigated copiously with normal saline.  Deep tissues were closed in layered fashion using 3-0 Monocryl and 3-0 nylon suture.  Soft dressing was placed.  She was placed in a short leg nonweightbearing splint with the ankle in neutral and hindfoot everted.  She was awakened from anesthesia and taken recovery in stable condition.  There were no  complications.  Counts were correct at the end of the case.   POST OPERATIVE INSTRUCTIONS: Nonweightbearing to operative extremity Keep splint dry and intact Patient will follow up in 2 weeks for splint removal, nonweightbearing x-rays of the operative foot and she will be placed into a short leg nonweightbearing cast.  She will remain nonweightbearing minimum of 6 weeks  TOURNIQUET TIME: 83 minutes  BLOOD LOSS:  Minimal         DRAINS: none         SPECIMEN: none       COMPLICATIONS:  * No complications entered in OR log *         Disposition: PACU - hemodynamically stable.         Condition: stable

## 2020-03-29 NOTE — Anesthesia Procedure Notes (Signed)
Performed by: Abhay Godbolt G, CRNA       

## 2020-03-29 NOTE — Anesthesia Procedure Notes (Signed)
Procedure Name: LMA Insertion Performed by: Glory Buff, CRNA Pre-anesthesia Checklist: Patient identified, Emergency Drugs available, Suction available, Patient being monitored and Timeout performed Patient Re-evaluated:Patient Re-evaluated prior to induction Oxygen Delivery Method: Circle system utilized Preoxygenation: Pre-oxygenation with 100% oxygen Induction Type: IV induction Ventilation: Mask ventilation without difficulty LMA: LMA inserted LMA Size: 4.0 Number of attempts: 2 Placement Confirmation: positive ETCO2 Tube secured with: Tape

## 2020-03-29 NOTE — Anesthesia Procedure Notes (Signed)
Anesthesia Regional Block: Popliteal block   Pre-Anesthetic Checklist: ,, timeout performed, Correct Patient, Correct Site, Correct Laterality, Correct Procedure, Correct Position, site marked, Risks and benefits discussed,  Surgical consent,  Pre-op evaluation,  At surgeon's request and post-op pain management  Laterality: Right  Prep: chloraprep       Needles:  Injection technique: Single-shot  Needle Type: Echogenic Stimulator Needle     Needle Length: 9cm  Needle Gauge: 21     Additional Needles:   Procedures:, nerve stimulator,,, ultrasound used (permanent image in chart),,,,   Nerve Stimulator or Paresthesia:  Response: dorsiflexion, 0.5 mA,   Additional Responses:   Narrative:  Start time: 03/29/2020 11:23 AM End time: 03/29/2020 11:30 AM Injection made incrementally with aspirations every 5 mL.  Performed by: Personally  Anesthesiologist: Suzette Battiest, MD

## 2020-03-29 NOTE — Discharge Instructions (Signed)
DR. [facility] POST-OP INSTRUCTIONS   Pain Management 1. The numbing medicine and your leg will last around 18 hours, take a dose of your pain medicine as soon as you feel it wearing off to avoid rebound pain. 2. Keep your foot elevated above heart level.  Make sure that your heel hangs free ('floats'). 3. Take all prescribed medication as directed. 4. If taking narcotic pain medication you may want to use an over-the-counter stool softener to avoid constipation. 5. You may take over-the-counter NSAIDs (ibuprofen, naproxen, etc.) as well as over-the-counter acetaminophen as directed on the packaging as a supplement for your pain and may also use it to wean away from the prescription medication.  Activity ? Non-weightbearing ? Keep splint intact  First Postoperative Visit 1. Your first postop visit will be at least 2 weeks after surgery.  This should be scheduled when you schedule surgery. 2. If you do not have a postoperative visit scheduled please call [phone] to schedule an appointment. 3. At the appointment your incision will be evaluated for suture removal, x-rays will be obtained if necessary.  General Instructions 1. Swelling is very common after foot and ankle surgery.  It often takes 3 months for the foot and ankle to begin to feel comfortable.  Some amount of swelling will persist for 6-12 months. 2. DO NOT change the dressing.  If there is a problem with the dressing (too tight, loose, gets wet, etc.) please contact Dr. Adair's office. 3. DO NOT get the dressing wet.  For showers you can use an over-the-counter cast cover or wrap a washcloth around the top of your dressing and then cover it with a plastic bag and tape it to your leg. 4. DO NOT soak the incision (no tubs, pools, bath, etc.) until you have approval from Dr. Adair.  Contact Dr. Adairs office or go to Emergency Room if: 1. Temperature above 101 F. 2. Increasing pain that is unresponsive to pain  medication or elevation 3. Excessive redness or swelling in your foot 4. Dressing problems - excessive bloody drainage, looseness or tightness, or if dressing gets wet 5. Develop pain, swelling, warmth, or discoloration of your calf   Post Anesthesia Home Care Instructions  Activity: Get plenty of rest for the remainder of the day. A responsible individual must stay with you for 24 hours following the procedure.  For the next 24 hours, DO NOT: -Drive a car -Operate machinery -Drink alcoholic beverages -Take any medication unless instructed by your physician -Make any legal decisions or sign important papers.  Meals: Start with liquid foods such as gelatin or soup. Progress to regular foods as tolerated. Avoid greasy, spicy, heavy foods. If nausea and/or vomiting occur, drink only clear liquids until the nausea and/or vomiting subsides. Call your physician if vomiting continues.  Special Instructions/Symptoms: Your throat may feel dry or sore from the anesthesia or the breathing tube placed in your throat during surgery. If this causes discomfort, gargle with warm salt water. The discomfort should disappear within 24 hours.  If you had a scopolamine patch placed behind your ear for the management of post- operative nausea and/or vomiting:  1. The medication in the patch is effective for 72 hours, after which it should be removed.  Wrap patch in a tissue and discard in the trash. Wash hands thoroughly with soap and water. 2. You may remove the patch earlier than 72 hours if you experience unpleasant side effects which may include dry mouth, dizziness   or visual disturbances. 3. Avoid touching the patch. Wash your hands with soap and water after contact with the patch.     Regional Anesthesia Blocks  1. Numbness or the inability to move the "blocked" extremity may last from 3-48 hours after placement. The length of time depends on the medication injected and your individual response to  the medication. If the numbness is not going away after 48 hours, call your surgeon.  2. The extremity that is blocked will need to be protected until the numbness is gone and the  Strength has returned. Because you cannot feel it, you will need to take extra care to avoid injury. Because it may be weak, you may have difficulty moving it or using it. You may not know what position it is in without looking at it while the block is in effect.  3. For blocks in the legs and feet, returning to weight bearing and walking needs to be done carefully. You will need to wait until the numbness is entirely gone and the strength has returned. You should be able to move your leg and foot normally before you try and bear weight or walk. You will need someone to be with you when you first try to ensure you do not fall and possibly risk injury.  4. Bruising and tenderness at the needle site are common side effects and will resolve in a few days.  5. Persistent numbness or new problems with movement should be communicated to the surgeon or the [facility] ([phone])/ [facility] ([phone]).  

## 2020-03-29 NOTE — Transfer of Care (Signed)
Immediate Anesthesia Transfer of Care Note  Patient: DESHANNA KAMA  Procedure(s) Performed: OPEN TREATMENT OF RIGHT FIFTH METATARSAL WITH REPAIR OF NONUNION, DEBRIDEMENT OF PERONEAL TENDONS, POSSIBLE DEEP TENDON TRANSFER (Right )  Patient Location: PACU  Anesthesia Type:GA combined with regional for post-op pain  Level of Consciousness: sedated, patient cooperative and responds to stimulation  Airway & Oxygen Therapy: Patient Spontanous Breathing and Patient connected to face mask oxygen  Post-op Assessment: Report given to RN and Post -op Vital signs reviewed and stable  Post vital signs: Reviewed and stable  Last Vitals:  Vitals Value Taken Time  BP 139/56 03/29/20 1438  Temp    Pulse 70 03/29/20 1440  Resp 16 03/29/20 1440  SpO2 100 % 03/29/20 1440  Vitals shown include unvalidated device data.  Last Pain:  Vitals:   03/29/20 1040  TempSrc: Oral  PainSc: 0-No pain      Patients Stated Pain Goal: 5 (43/70/05 2591)  Complications: No complications documented.

## 2020-03-30 ENCOUNTER — Encounter (HOSPITAL_BASED_OUTPATIENT_CLINIC_OR_DEPARTMENT_OTHER): Payer: Self-pay | Admitting: Orthopaedic Surgery

## 2020-07-10 DIAGNOSIS — M25552 Pain in left hip: Secondary | ICD-10-CM | POA: Diagnosis not present

## 2020-07-10 DIAGNOSIS — M25562 Pain in left knee: Secondary | ICD-10-CM | POA: Diagnosis not present

## 2020-07-10 DIAGNOSIS — M7062 Trochanteric bursitis, left hip: Secondary | ICD-10-CM | POA: Diagnosis not present

## 2020-09-09 ENCOUNTER — Observation Stay (HOSPITAL_COMMUNITY)
Admission: EM | Admit: 2020-09-09 | Discharge: 2020-09-10 | Disposition: A | Discharge status: Home / Self Care | Payer: 59 | Attending: Internal Medicine | Admitting: Internal Medicine

## 2020-09-09 ENCOUNTER — Emergency Department (HOSPITAL_COMMUNITY): Payer: 59

## 2020-09-09 DIAGNOSIS — E876 Hypokalemia: Secondary | ICD-10-CM | POA: Diagnosis not present

## 2020-09-09 DIAGNOSIS — E785 Hyperlipidemia, unspecified: Secondary | ICD-10-CM | POA: Diagnosis not present

## 2020-09-09 DIAGNOSIS — I4891 Unspecified atrial fibrillation: Secondary | ICD-10-CM | POA: Diagnosis not present

## 2020-09-09 DIAGNOSIS — Z87891 Personal history of nicotine dependence: Secondary | ICD-10-CM | POA: Diagnosis not present

## 2020-09-09 DIAGNOSIS — R7989 Other specified abnormal findings of blood chemistry: Secondary | ICD-10-CM | POA: Diagnosis present

## 2020-09-09 DIAGNOSIS — Z20822 Contact with and (suspected) exposure to covid-19: Secondary | ICD-10-CM | POA: Insufficient documentation

## 2020-09-09 DIAGNOSIS — D72829 Elevated white blood cell count, unspecified: Secondary | ICD-10-CM | POA: Diagnosis present

## 2020-09-09 DIAGNOSIS — R778 Other specified abnormalities of plasma proteins: Secondary | ICD-10-CM | POA: Diagnosis not present

## 2020-09-09 DIAGNOSIS — R079 Chest pain, unspecified: Secondary | ICD-10-CM | POA: Diagnosis present

## 2020-09-09 DIAGNOSIS — R739 Hyperglycemia, unspecified: Secondary | ICD-10-CM | POA: Diagnosis not present

## 2020-09-09 DIAGNOSIS — I480 Paroxysmal atrial fibrillation: Secondary | ICD-10-CM | POA: Diagnosis not present

## 2020-09-09 MED ORDER — SODIUM CHLORIDE 0.9 % IV BOLUS (SEPSIS)
1000.0000 mL | Freq: Once | INTRAVENOUS | Status: AC
  Administered 2020-09-10: 1000 mL via INTRAVENOUS

## 2020-09-09 MED ORDER — SODIUM CHLORIDE 0.9 % IV SOLN
1000.0000 mL | INTRAVENOUS | Status: DC
  Administered 2020-09-10: 1000 mL via INTRAVENOUS

## 2020-09-09 NOTE — ED Notes (Signed)
HR noted at 206 on cardiac monitor.

## 2020-09-09 NOTE — ED Provider Notes (Addendum)
Emergency Medicine Provider Triage Evaluation Note  Nicole Anderson , a 55 y.o. female  was evaluated in triage.  Pt complains of sudden onset of chest pain and palpitations x15 minutes prior to arrival.  Patient states she was driving home from visiting her parents and felt "tired and not good. "Patient states she felt her heart was racing and her chest felt "not right."  She denies any drug or alcohol use. She has family history of cardiac disease.  She denies any personal history of A. fib. Tried vagal maneuvers PTA without symptom improvement.  Review of Systems  Positive: Palpitations, chest pain, diaphoresis Negative: Back pain, nausea, vomiting, shortness of breath  Physical Exam  BP (!) 163/94 (BP Location: Left Arm)   Pulse 90   Temp 97.9 F (36.6 C)   Resp 20   SpO2 98%  Gen:   Awake, no distress   Resp:  Normal effort  MSK:   Moves extremities without difficulty  Other:  Irregular heart rate.  Heart rate in the 180s on the monitor during exam  Medical Decision Making  Medically screening exam initiated at 11:17 PM.  Appropriate orders placed.  [name] was informed that the remainder of the evaluation will be completed by another provider, this initial triage assessment does not replace that evaluation, and the importance of remaining in the ED until their evaluation is complete.  Cardiac work-up initiated.  EKG in triage shows A. fib with RVR.  Charge RN  Mali made aware patient needs room in the back immediately.   Portions of this note were generated with [employer]. Dictation errors may occur despite best attempts at proofreading.    Barrie Folk, PA-C 09/09/20 2325    Barrie Folk, PA-C 09/10/20 0003    Fatima Blank, MD 09/10/20 [phone]

## 2020-09-09 NOTE — ED Notes (Signed)
Patient on cardiac monitor in triage.

## 2020-09-09 NOTE — ED Triage Notes (Signed)
Pt c/o CP x17mn, states "tired & don't feel good." Has worn holter monitor. Pt states she feels she's in afib. 186 in triage, pt never diagnosed w afib, familial hx of same. Denies exacerbating factors, but states she can feel HR. Denies NVD, denies [facility]

## 2020-09-09 NOTE — ED Notes (Signed)
EKG to Dr. Leonette Monarch. Needs bed ASAP.

## 2020-09-10 ENCOUNTER — Other Ambulatory Visit: Payer: Self-pay | Admitting: Cardiology

## 2020-09-10 ENCOUNTER — Observation Stay (HOSPITAL_BASED_OUTPATIENT_CLINIC_OR_DEPARTMENT_OTHER): Payer: 59

## 2020-09-10 ENCOUNTER — Emergency Department (HOSPITAL_COMMUNITY): Payer: 59

## 2020-09-10 ENCOUNTER — Observation Stay (HOSPITAL_COMMUNITY): Payer: 59

## 2020-09-10 ENCOUNTER — Other Ambulatory Visit: Payer: Self-pay

## 2020-09-10 ENCOUNTER — Encounter (HOSPITAL_COMMUNITY): Payer: Self-pay | Admitting: Internal Medicine

## 2020-09-10 DIAGNOSIS — I4891 Unspecified atrial fibrillation: Secondary | ICD-10-CM | POA: Diagnosis not present

## 2020-09-10 DIAGNOSIS — R079 Chest pain, unspecified: Secondary | ICD-10-CM | POA: Diagnosis present

## 2020-09-10 DIAGNOSIS — Z20822 Contact with and (suspected) exposure to covid-19: Secondary | ICD-10-CM | POA: Diagnosis not present

## 2020-09-10 DIAGNOSIS — D72829 Elevated white blood cell count, unspecified: Secondary | ICD-10-CM

## 2020-09-10 DIAGNOSIS — R002 Palpitations: Secondary | ICD-10-CM

## 2020-09-10 DIAGNOSIS — I480 Paroxysmal atrial fibrillation: Secondary | ICD-10-CM

## 2020-09-10 DIAGNOSIS — Z87891 Personal history of nicotine dependence: Secondary | ICD-10-CM | POA: Diagnosis not present

## 2020-09-10 DIAGNOSIS — R739 Hyperglycemia, unspecified: Secondary | ICD-10-CM

## 2020-09-10 DIAGNOSIS — J9811 Atelectasis: Secondary | ICD-10-CM | POA: Diagnosis not present

## 2020-09-10 DIAGNOSIS — R778 Other specified abnormalities of plasma proteins: Secondary | ICD-10-CM

## 2020-09-10 DIAGNOSIS — E876 Hypokalemia: Secondary | ICD-10-CM | POA: Diagnosis not present

## 2020-09-10 DIAGNOSIS — E785 Hyperlipidemia, unspecified: Secondary | ICD-10-CM | POA: Diagnosis not present

## 2020-09-10 LAB — T4, FREE: Free T4: 0.85 ng/dL (ref 0.61–1.12)

## 2020-09-10 LAB — CBC WITH DIFFERENTIAL/PLATELET
Abs Immature Granulocytes: 0.04 10*3/uL (ref 0.00–0.07)
Basophils Absolute: 0.1 10*3/uL (ref 0.0–0.1)
Basophils Relative: 1 %
Eosinophils Absolute: 0.1 10*3/uL (ref 0.0–0.5)
Eosinophils Relative: 1 %
HCT: 38.9 % (ref 36.0–46.0)
Hemoglobin: 12.5 g/dL (ref 12.0–15.0)
Immature Granulocytes: 0 %
Lymphocytes Relative: 27 %
Lymphs Abs: 2.9 10*3/uL (ref 0.7–4.0)
MCH: 30.3 pg (ref 26.0–34.0)
MCHC: 32.1 g/dL (ref 30.0–36.0)
MCV: 94.2 fL (ref 80.0–100.0)
Monocytes Absolute: 0.7 10*3/uL (ref 0.1–1.0)
Monocytes Relative: 7 %
Neutro Abs: 6.8 10*3/uL (ref 1.7–7.7)
Neutrophils Relative %: 64 %
Platelets: 342 10*3/uL (ref 150–400)
RBC: 4.13 MIL/uL (ref 3.87–5.11)
RDW: 14 % (ref 11.5–15.5)
WBC: 10.6 10*3/uL — ABNORMAL HIGH (ref 4.0–10.5)
nRBC: 0 % (ref 0.0–0.2)

## 2020-09-10 LAB — CBC
HCT: 43.9 % (ref 36.0–46.0)
Hemoglobin: 14.2 g/dL (ref 12.0–15.0)
MCH: 30.3 pg (ref 26.0–34.0)
MCHC: 32.3 g/dL (ref 30.0–36.0)
MCV: 93.6 fL (ref 80.0–100.0)
Platelets: 374 10*3/uL (ref 150–400)
RBC: 4.69 MIL/uL (ref 3.87–5.11)
RDW: 13.9 % (ref 11.5–15.5)
WBC: 13.8 10*3/uL — ABNORMAL HIGH (ref 4.0–10.5)
nRBC: 0 % (ref 0.0–0.2)

## 2020-09-10 LAB — URINALYSIS, ROUTINE W REFLEX MICROSCOPIC
Bilirubin Urine: NEGATIVE
Glucose, UA: NEGATIVE mg/dL
Hgb urine dipstick: NEGATIVE
Ketones, ur: NEGATIVE mg/dL
Leukocytes,Ua: NEGATIVE
Nitrite: NEGATIVE
Protein, ur: NEGATIVE mg/dL
Specific Gravity, Urine: 1.008 (ref 1.005–1.030)
pH: 7 (ref 5.0–8.0)

## 2020-09-10 LAB — BASIC METABOLIC PANEL
Anion gap: 10 (ref 5–15)
BUN: 16 mg/dL (ref 6–20)
CO2: 25 mmol/L (ref 22–32)
Calcium: 10 mg/dL (ref 8.9–10.3)
Chloride: 104 mmol/L (ref 98–111)
Creatinine, Ser: 0.94 mg/dL (ref 0.44–1.00)
GFR, Estimated: >60 mL/min (ref >60)
Glucose, Bld: 141 mg/dL — ABNORMAL HIGH (ref 70–99)
Potassium: 3.4 mmol/L — ABNORMAL LOW (ref 3.5–5.1)
Sodium: 139 mmol/L (ref 135–145)

## 2020-09-10 LAB — ECHOCARDIOGRAM COMPLETE
Area-P 1/2: 3.76 cm2
S' Lateral: 3.1 cm
Single Plane A4C EF: 63.5 %

## 2020-09-10 LAB — RENAL FUNCTION PANEL
Albumin: 3.2 g/dL — ABNORMAL LOW (ref 3.5–5.0)
Anion gap: 8 (ref 5–15)
BUN: 12 mg/dL (ref 6–20)
CO2: 23 mmol/L (ref 22–32)
Calcium: 8.6 mg/dL — ABNORMAL LOW (ref 8.9–10.3)
Chloride: 108 mmol/L (ref 98–111)
Creatinine, Ser: 0.65 mg/dL (ref 0.44–1.00)
GFR, Estimated: >60 mL/min (ref >60)
Glucose, Bld: 99 mg/dL (ref 70–99)
Phosphorus: 3.6 mg/dL (ref 2.5–4.6)
Potassium: 3.8 mmol/L (ref 3.5–5.1)
Sodium: 139 mmol/L (ref 135–145)

## 2020-09-10 LAB — RESP PANEL BY RT-PCR (FLU A&B, COVID) ARPGX2
Influenza A by PCR: NEGATIVE
Influenza B by PCR: NEGATIVE
SARS Coronavirus 2 by RT PCR: NEGATIVE

## 2020-09-10 LAB — TROPONIN I (HIGH SENSITIVITY)
Troponin I (High Sensitivity): 21 ng/L — ABNORMAL HIGH (ref <18)
Troponin I (High Sensitivity): 34 ng/L — ABNORMAL HIGH (ref <18)
Troponin I (High Sensitivity): 30 ng/L — ABNORMAL HIGH (ref <18)

## 2020-09-10 LAB — LIPID PANEL
Cholesterol: 208 mg/dL — ABNORMAL HIGH (ref 0–200)
HDL: 42 mg/dL (ref >40)
LDL Cholesterol: 146 mg/dL — ABNORMAL HIGH (ref 0–99)
Total CHOL/HDL Ratio: 5 RATIO
Triglycerides: 101 mg/dL (ref <150)
VLDL: 20 mg/dL (ref 0–40)

## 2020-09-10 LAB — MAGNESIUM
Magnesium: 1.9 mg/dL (ref 1.7–2.4)
Magnesium: 2 mg/dL (ref 1.7–2.4)

## 2020-09-10 LAB — HIV ANTIBODY (ROUTINE TESTING W REFLEX): HIV Screen 4th Generation wRfx: NONREACTIVE

## 2020-09-10 LAB — HEMOGLOBIN A1C
Hgb A1c MFr Bld: 6.1 % — ABNORMAL HIGH (ref 4.8–5.6)
Mean Plasma Glucose: 128.37 mg/dL

## 2020-09-10 LAB — TSH: TSH: 3.116 u[IU]/mL (ref 0.350–4.500)

## 2020-09-10 MED ORDER — POTASSIUM CHLORIDE CRYS ER 20 MEQ PO TBCR
40.0000 meq | EXTENDED_RELEASE_TABLET | Freq: Once | ORAL | Status: AC
  Administered 2020-09-10: 40 meq via ORAL
  Filled 2020-09-10: qty 2

## 2020-09-10 MED ORDER — IOHEXOL 350 MG/ML SOLN
50.0000 mL | Freq: Once | INTRAVENOUS | Status: AC | PRN
  Administered 2020-09-10: 50 mL via INTRAVENOUS

## 2020-09-10 MED ORDER — METOPROLOL TARTRATE 25 MG PO TABS
25.0000 mg | ORAL_TABLET | Freq: Two times a day (BID) | ORAL | Status: DC
  Administered 2020-09-10: 25 mg via ORAL
  Filled 2020-09-10: qty 1

## 2020-09-10 MED ORDER — METOPROLOL TARTRATE 25 MG PO TABS: 12.5000 mg | ORAL_TABLET | Freq: Two times a day (BID) | ORAL | 0 refills | Status: DC

## 2020-09-10 MED ORDER — POTASSIUM CHLORIDE 10 MEQ/100ML IV SOLN
10.0000 meq | Freq: Once | INTRAVENOUS | Status: AC
  Administered 2020-09-10: 10 meq via INTRAVENOUS
  Filled 2020-09-10: qty 100

## 2020-09-10 MED ORDER — ACETAMINOPHEN 325 MG PO TABS: 650.0000 mg | ORAL_TABLET | Freq: Four times a day (QID) | ORAL | Status: DC | PRN

## 2020-09-10 MED ORDER — NITROGLYCERIN 0.4 MG SL SUBL
0.4000 mg | SUBLINGUAL_TABLET | SUBLINGUAL | Status: DC | PRN
Start: 2020-09-10 — End: 2020-09-10

## 2020-09-10 MED ORDER — ONDANSETRON HCL 4 MG/2ML IJ SOLN: 4.0000 mg | Freq: Four times a day (QID) | INTRAMUSCULAR | Status: DC | PRN

## 2020-09-10 MED ORDER — NITROGLYCERIN 0.4 MG SL SUBL: 0.4000 mg | SUBLINGUAL_TABLET | SUBLINGUAL | 0 refills | Status: DC | PRN

## 2020-09-10 MED ORDER — ACETAMINOPHEN 325 MG PO TABS: 650.0000 mg | ORAL_TABLET | ORAL | Status: DC | PRN

## 2020-09-10 MED ORDER — ENOXAPARIN SODIUM 40 MG/0.4ML IJ SOSY
40.0000 mg | PREFILLED_SYRINGE | Freq: Every day | INTRAMUSCULAR | Status: DC
  Administered 2020-09-10: 40 mg via SUBCUTANEOUS
  Filled 2020-09-10: qty 0.4

## 2020-09-10 NOTE — Discharge Summary (Addendum)
Physician Discharge Summary  Nicole Anderson [id number] DOB: Mar 12, 1965 DOA: 09/09/2020  PCP: Pcp, No  Admit date: 09/09/2020 Discharge date: 09/10/2020  Admitted From: Home Disposition: Home  Recommendations for Outpatient Follow-up:  Follow up with PCP in 1 week  Outpatient follow-up with cardiology Follow up in ED if symptoms worsen or new appear   Home Health: No Equipment/Devices: None  Discharge Condition: Stable CODE STATUS: Full Diet recommendation: Heart healthy  Brief/Interim Summary: 55 year old female with no past medical history presented with chest pain and palpitations.  On presentation, she was found to be in A. fib with RVR but patient spontaneously converted to normal sinus rhythm.  Troponins were 21, 34 and 30.  Cardiology was consulted who recommended starting low-dose metoprolol and outpatient follow-up with cardiology.  Cardiology has cleared the patient for discharge.  She will be discharged home today.  Discharge Diagnoses:   Paroxysmal A. fib with RVR Mildly positive troponin/chest pain -Presented with chest pain and palpitations.  She was found to be in A. fib with RVR but patient spontaneously converted to normal sinus rhythm.  Troponins were 21, 34 and 30.   -CTA chest was negative for PE  -Echo showed EF of 65 to 70% with grade 1 diastolic dysfunction -cardiology was consulted who recommended starting low-dose metoprolol and outpatient follow-up with cardiology.  Chest pain has improved.  Cardiology has cleared the patient for discharge.  She will be discharged home today.  Leukocytosis -Probably reactive.  Resolved  Hyperglycemia -A1c 6.1.  Outpatient follow-up  Hyperlipidemia -Cholesterol 208.  Follow-up with PCP regarding starting statins.  Hypokalemia -Improved  Discharge Instructions  Discharge Instructions     Ambulatory referral to Cardiology   Complete by: As directed    Diet - low sodium heart healthy   Complete by: As  directed    Increase activity slowly   Complete by: As directed       Allergies as of 09/10/2020       Reactions   Ketorolac Tromethamine Rash   Macrobid [nitrofurantoin] Rash   Dilaudid [hydromorphone] Itching        Medication List     TAKE these medications    levonorgestrel 20 MCG/24HR IUD Commonly known as: MIRENA 1 each by Intrauterine route once.   metoprolol tartrate 25 MG tablet Commonly known as: LOPRESSOR Take 0.5 tablets (12.5 mg total) by mouth 2 (two) times daily.   nitroGLYCERIN 0.4 MG SL tablet Commonly known as: NITROSTAT Place 1 tablet (0.4 mg total) under the tongue every 5 (five) minutes as needed for chest pain.        Follow-up [facility] Follow up on 09/26/2020.   Specialty: Cardiology Why: at 8:40AM for your post hospital cardiology follow up with Dr Myrlene Broker information: [place] [phone]        Buford Dresser, MD. Schedule an appointment as soon as possible for a visit in 1 week(s).   Specialty: Cardiology Contact information: [address] [place] [address] [place] [address] [phone]                Allergies  Allergen Reactions   Ketorolac Tromethamine Rash   Macrobid [Nitrofurantoin] Rash   Dilaudid [Hydromorphone] Itching    Consultations: Cardiology   Procedures/Studies: CT Angio Chest Pulmonary Embolism (PE) W or WO Contrast  Result Date: 09/10/2020 CLINICAL DATA:  55 year old female with atrial fibrillation, chest pain, malaise. RVR. EXAM: CT ANGIOGRAPHY CHEST WITH  CONTRAST TECHNIQUE: Multidetector CT imaging of the chest was performed using the standard protocol during bolus administration of intravenous contrast. Multiplanar CT image reconstructions and MIPs were obtained to evaluate the vascular anatomy. CONTRAST:  33m OMNIPAQUE IOHEXOL 350 MG/ML SOLN COMPARISON:  Portable chest  0018 hours today. FINDINGS: Cardiovascular: Good contrast bolus timing in the pulmonary arterial tree. No focal filling defect identified in the pulmonary arteries to suggest acute pulmonary embolism. Cardiac size within normal limits. Some left coronary calcified atherosclerosis suspected on series 7, image 180. No pericardial effusion. Little contrast in the aorta which appears negative. Mediastinum/Nodes: Negative.  No lymphadenopathy. Lungs/Pleura: Somewhat low lung volumes with bilateral pulmonary ground-glass and more confluent dependent sub solid pulmonary opacity most compatible with atelectasis. Mild mosaic attenuation in the upper lobe suggesting some gas trapping. Major airways remain patent. No pleural effusion or consolidation. Upper Abdomen: Small calcified granuloma in the left anterior pathic lobe. Absent gallbladder. Otherwise negative visible liver, spleen, pancreas, adrenal glands and bowel in the upper abdomen. Musculoskeletal: No acute osseous abnormality identified. Review of the MIP images confirms the above findings. IMPRESSION: 1. No evidence of acute pulmonary embolus. 2. Mild pulmonary atelectasis and gas trapping. No overt edema or pleural effusion. 3. Suspected left coronary artery atherosclerosis. Electronically Signed   By: HGenevie AnnM.D.   On: 09/10/2020 06:28   DG Chest Portable 1 View  Result Date: 09/10/2020 CLINICAL DATA:  Atrial fibrillation and chest pain, initial encounter EXAM: PORTABLE CHEST 1 VIEW COMPARISON:  None. FINDINGS: The heart size and mediastinal contours are within normal limits. Both lungs are clear. The visualized skeletal structures are unremarkable. IMPRESSION: No active disease. Electronically Signed   By: MInez CatalinaM.D.   On: 09/10/2020 00:33   ECHOCARDIOGRAM COMPLETE  Result Date: 09/10/2020    ECHOCARDIOGRAM REPORT   Patient Name:   Nicole VENTRYDate of Exam: 09/10/2020 Medical Rec #:  [id number]    Height:       66.0 in Accession #:    [id number]    Weight:       272.3 lb Date of Birth:  101/05/1965    BSA:          2.280 m Patient Age:    89years      BP:           153/51 mmHg Patient Gender: F             HR:           66 bpm. Exam Location:  Inpatient Procedure: 2D Echo, Cardiac Doppler and Color Doppler Indications:    Atrial fibrillation  History:        Patient has no prior history of Echocardiogram examinations.                 Arrythmias:Atrial Fibrillation, Signs/Symptoms:Dyspnea and                 elevated troponin; Risk Factors:Obesity.  Sonographer:    BDustin FlockRDCS Referring Phys: [id number] 1. Left ventricular ejection fraction, by estimation, is 65 to 70%. The left ventricle has normal function. The left ventricle has no regional wall motion abnormalities. There is moderate concentric left ventricular hypertrophy. Left ventricular diastolic parameters are consistent with Grade I diastolic dysfunction (impaired relaxation).  2. Right ventricular systolic function is normal. The right ventricular size is normal. There is mildly elevated pulmonary artery systolic pressure.  3. The mitral valve is  normal in structure. No evidence of mitral valve regurgitation. No evidence of mitral stenosis.  4. The aortic valve is tricuspid. Aortic valve regurgitation is not visualized. No aortic stenosis is present.  5. The inferior vena cava is normal in size with greater than 50% respiratory variability, suggesting right atrial pressure of 3 mmHg. Comparison(s): No prior Echocardiogram. FINDINGS  Left Ventricle: Left ventricular ejection fraction, by estimation, is 65 to 70%. The left ventricle has normal function. The left ventricle has no regional wall motion abnormalities. The left ventricular internal cavity size was normal in size. There is  moderate concentric left ventricular hypertrophy. Left ventricular diastolic parameters are consistent with Grade I diastolic dysfunction (impaired relaxation). Right Ventricle:  The right ventricular size is normal. No increase in right ventricular wall thickness. Right ventricular systolic function is normal. There is mildly elevated pulmonary artery systolic pressure. The tricuspid regurgitant velocity is 2.99  m/s, and with an assumed right atrial pressure of 3 mmHg, the estimated right ventricular systolic pressure is [id number] mmHg. Left Atrium: Left atrial size was normal in size. Right Atrium: Right atrial size was normal in size. Pericardium: There is no evidence of pericardial effusion. Mitral Valve: The mitral valve is normal in structure. No evidence of mitral valve regurgitation. No evidence of mitral valve stenosis. Tricuspid Valve: The tricuspid valve is normal in structure. Tricuspid valve regurgitation is trivial. No evidence of tricuspid stenosis. Aortic Valve: The aortic valve is tricuspid. Aortic valve regurgitation is not visualized. No aortic stenosis is present. Pulmonic Valve: The pulmonic valve was normal in structure. Pulmonic valve regurgitation is not visualized. No evidence of pulmonic stenosis. Aorta: The aortic root is normal in size and structure. Venous: The inferior vena cava is normal in size with greater than 50% respiratory variability, suggesting right atrial pressure of 3 mmHg. IAS/Shunts: The atrial septum is grossly normal.  LEFT VENTRICLE PLAX 2D LVIDd:         5.00 cm     Diastology LVIDs:         3.10 cm     LV e' medial:    8.16 cm/s LV PW:         1.30 cm     LV E/e' medial:  11.0 LV IVS:        1.20 cm     LV e' lateral:   9.36 cm/s LVOT diam:     2.10 cm     LV E/e' lateral: 9.6 LV SV:         93 LV SV Index:   41 LVOT Area:     3.46 cm  LV Volumes (MOD) LV vol d, MOD A4C: 99.6 ml LV vol s, MOD A4C: 36.4 ml LV SV MOD A4C:     99.6 ml RIGHT VENTRICLE RV Basal diam:  3.20 cm RV S prime:     14.90 cm/s TAPSE (M-mode): 2.1 cm LEFT ATRIUM             Index       RIGHT ATRIUM           Index LA diam:        4.00 cm 1.75 cm/m  RA Area:     11.80 cm LA  Vol (A2C):   38.8 ml 17.02 ml/m RA Volume:   24.40 ml  10.70 ml/m LA Vol (A4C):   52.5 ml 23.03 ml/m LA Biplane Vol: 47.7 ml 20.92 ml/m  AORTIC VALVE LVOT Vmax:   120.00 cm/s LVOT Vmean:  90.800  cm/s LVOT VTI:    0.269 m  AORTA Ao Root diam: 3.20 cm MITRAL VALVE               TRICUSPID VALVE MV Area (PHT): 3.76 cm    TR Peak grad:   35.8 mmHg MV Decel Time: 202 msec    TR Vmax:        299.00 cm/s MV E velocity: 89.70 cm/s MV A velocity: 68.90 cm/s  SHUNTS MV E/A ratio:  1.30        Systemic VTI:  0.27 m                            Systemic Diam: 2.10 cm Rudean Haskell MD Electronically signed by Rudean Haskell MD Signature Date/Time: 09/10/2020/12:29:21 PM    Final       Subjective: Patient seen and examined at bedside.  Denied current chest pain.  No overnight fever or vomiting reported.  Discharge Exam: Vitals:   09/10/20 1124 09/10/20 1236  BP: (!) 143/62 (!) 127/52  Pulse: 64 66  Resp: 18 17  Temp:    SpO2: 100% 97%    General: Pt is alert, awake, not in acute distress Cardiovascular: rate controlled, S1/S2 + Respiratory: bilateral decreased breath sounds at bases Abdominal: Soft, NT, ND, bowel sounds + Extremities: no edema, no cyanosis    The results of significant diagnostics from this hospitalization (including imaging, microbiology, ancillary and laboratory) are listed below for reference.     Microbiology: Recent Results (from the past 240 hour(s))  Resp Panel by RT-PCR (Flu A&B, Covid) Nasopharyngeal Swab     Status: None   Collection Time: 09/10/20  5:34 AM   Specimen: Nasopharyngeal Swab; Nasopharyngeal(NP) swabs in vial transport medium  Result Value Ref Range Status   SARS Coronavirus 2 by RT PCR NEGATIVE NEGATIVE Final    Comment: (NOTE) SARS-CoV-2 target nucleic acids are NOT DETECTED.  The SARS-CoV-2 RNA is generally detectable in upper respiratory specimens during the acute phase of infection. The lowest concentration of SARS-CoV-2 viral  copies this assay can detect is 138 copies/mL. A negative result does not preclude SARS-Cov-2 infection and should not be used as the sole basis for treatment or other patient management decisions. A negative result may occur with  improper specimen collection/handling, submission of specimen other than nasopharyngeal swab, presence of viral mutation(s) within the areas targeted by this assay, and inadequate number of viral copies(<138 copies/mL). A negative result must be combined with clinical observations, patient history, and epidemiological information. The expected result is Negative.  Fact Sheet for Patients:  EntrepreneurPulse.com.au  Fact Sheet for Healthcare Providers:  IncredibleEmployment.be  This test is no t yet approved or cleared by the [place] FDA and  has been authorized for detection and/or diagnosis of SARS-CoV-2 by FDA under an Emergency Use Authorization (EUA). This EUA will remain  in effect (meaning this test can be used) for the duration of the COVID-19 declaration under Section 564(b)(1) of the Act, 21 U.S.C.section 360bbb-3(b)(1), unless the authorization is terminated  or revoked sooner.       Influenza A by PCR NEGATIVE NEGATIVE Final   Influenza B by PCR NEGATIVE NEGATIVE Final    Comment: (NOTE) The Xpert Xpress SARS-CoV-2/FLU/RSV plus assay is intended as an aid in the diagnosis of influenza from Nasopharyngeal swab specimens and should not be used as a sole basis for treatment. Nasal washings and aspirates are unacceptable for Xpert Xpress SARS-CoV-2/FLU/RSV  testing.  Fact Sheet for Patients: EntrepreneurPulse.com.au  Fact Sheet for Healthcare Providers: IncredibleEmployment.be  This test is not yet approved or cleared by the [place] FDA and has been authorized for detection and/or diagnosis of SARS-CoV-2 by FDA under an Emergency Use Authorization (EUA). This  EUA will remain in effect (meaning this test can be used) for the duration of the COVID-19 declaration under Section 564(b)(1) of the Act, 21 U.S.C. section 360bbb-3(b)(1), unless the authorization is terminated or revoked.  Performed at [facility], [place] [address] [place], [place] [address]      Labs: BNP (last 3 results) No results for input(s): BNP in the last 8760 hours. Basic Metabolic Panel: Recent Labs  Lab 09/10/20 0018 09/10/20 0742  NA 139 139  K 3.4* 3.8  CL 104 108  CO2 25 23  GLUCOSE 141* 99  BUN 16 12  CREATININE 0.94 0.65  CALCIUM 10.0 8.6*  MG 2.0 1.9  PHOS  --  3.6   Liver Function Tests: Recent Labs  Lab 09/10/20 0742  ALBUMIN 3.2*   No results for input(s): LIPASE, AMYLASE in the last 168 hours. No results for input(s): AMMONIA in the last 168 hours. CBC: Recent Labs  Lab 09/10/20 0018 09/10/20 0742  WBC 13.8* 10.6*  NEUTROABS  --  6.8  HGB 14.2 12.5  HCT 43.9 38.9  MCV 93.6 94.2  PLT 374 342   Cardiac Enzymes: No results for input(s): CKTOTAL, CKMB, CKMBINDEX, TROPONINI in the last 168 hours. BNP: Invalid input(s): POCBNP CBG: No results for input(s): GLUCAP in the last 168 hours. D-Dimer No results for input(s): DDIMER in the last 72 hours. Hgb A1c Recent Labs    09/10/20 0742  HGBA1C 6.1*   Lipid Profile Recent Labs    09/10/20 0742  CHOL 208*  HDL 42  LDLCALC 146*  TRIG 101  CHOLHDL 5.0   Thyroid function studies Recent Labs    09/10/20 0018  TSH 3.116   Anemia work up No results for input(s): VITAMINB12, FOLATE, FERRITIN, TIBC, IRON, RETICCTPCT in the last 72 hours. Urinalysis    Component Value Date/Time   COLORURINE STRAW (A) 09/10/2020 0153   APPEARANCEUR HAZY (A) 09/10/2020 0153   LABSPEC 1.008 09/10/2020 0153   PHURINE 7.0 09/10/2020 0153   GLUCOSEU NEGATIVE 09/10/2020 0153   HGBUR NEGATIVE 09/10/2020 0153   BILIRUBINUR NEGATIVE 09/10/2020 0153   KETONESUR NEGATIVE 09/10/2020 0153    PROTEINUR NEGATIVE 09/10/2020 0153   NITRITE NEGATIVE 09/10/2020 0153   LEUKOCYTESUR NEGATIVE 09/10/2020 0153   Sepsis Labs Invalid input(s): PROCALCITONIN,  WBC,  LACTICIDVEN Microbiology Recent Results (from the past 240 hour(s))  Resp Panel by RT-PCR (Flu A&B, Covid) Nasopharyngeal Swab     Status: None   Collection Time: 09/10/20  5:34 AM   Specimen: Nasopharyngeal Swab; Nasopharyngeal(NP) swabs in vial transport medium  Result Value Ref Range Status   SARS Coronavirus 2 by RT PCR NEGATIVE NEGATIVE Final    Comment: (NOTE) SARS-CoV-2 target nucleic acids are NOT DETECTED.  The SARS-CoV-2 RNA is generally detectable in upper respiratory specimens during the acute phase of infection. The lowest concentration of SARS-CoV-2 viral copies this assay can detect is 138 copies/mL. A negative result does not preclude SARS-Cov-2 infection and should not be used as the sole basis for treatment or other patient management decisions. A negative result may occur with  improper specimen collection/handling, submission of specimen other than nasopharyngeal swab, presence of viral mutation(s) within the areas targeted by this assay,  and inadequate number of viral copies(<138 copies/mL). A negative result must be combined with clinical observations, patient history, and epidemiological information. The expected result is Negative.  Fact Sheet for Patients:  EntrepreneurPulse.com.au  Fact Sheet for Healthcare Providers:  IncredibleEmployment.be  This test is no t yet approved or cleared by the [place] FDA and  has been authorized for detection and/or diagnosis of SARS-CoV-2 by FDA under an Emergency Use Authorization (EUA). This EUA will remain  in effect (meaning this test can be used) for the duration of the COVID-19 declaration under Section 564(b)(1) of the Act, 21 U.S.C.section 360bbb-3(b)(1), unless the authorization is terminated  or revoked  sooner.       Influenza A by PCR NEGATIVE NEGATIVE Final   Influenza B by PCR NEGATIVE NEGATIVE Final    Comment: (NOTE) The Xpert Xpress SARS-CoV-2/FLU/RSV plus assay is intended as an aid in the diagnosis of influenza from Nasopharyngeal swab specimens and should not be used as a sole basis for treatment. Nasal washings and aspirates are unacceptable for Xpert Xpress SARS-CoV-2/FLU/RSV testing.  Fact Sheet for Patients: EntrepreneurPulse.com.au  Fact Sheet for Healthcare Providers: IncredibleEmployment.be  This test is not yet approved or cleared by the [place] FDA and has been authorized for detection and/or diagnosis of SARS-CoV-2 by FDA under an Emergency Use Authorization (EUA). This EUA will remain in effect (meaning this test can be used) for the duration of the COVID-19 declaration under Section 564(b)(1) of the Act, 21 U.S.C. section 360bbb-3(b)(1), unless the authorization is terminated or revoked.  Performed at [facility], [place] [address] [place], [place] [address]      Time coordinating discharge: 35 minutes  SIGNED:   Aline August, MD  [facility] 09/10/2020, 4:11 PM

## 2020-09-10 NOTE — ED Notes (Signed)
Patient Sinus Tach 102 on monitor. PA in triage room at this time.

## 2020-09-10 NOTE — ED Notes (Signed)
Patient discharge instructions reviewed with the patient. The patient verbalized understanding of instructions. Patient discharged. 

## 2020-09-10 NOTE — Progress Notes (Signed)
Patient ID: UNNAMED FORSYTH, female   DOB: Apr 20, 1965, 55 y.o.   MRN: [id number] Patient admitted to this morning for A. fib with RVR and chest pain with slight elevated troponin.  Patient seen at bedside and plan of care discussed with her.  I have reviewed patient's medical records including this morning's H&P, current vitals, labs, medications myself.  I have consulted cardiology.

## 2020-09-10 NOTE — Progress Notes (Signed)
  Echocardiogram 2D Echocardiogram has been performed.  Nicole Anderson 09/10/2020, 10:41 AM

## 2020-09-10 NOTE — ED Notes (Signed)
Patient transported to Ultrasound 

## 2020-09-10 NOTE — ED Provider Notes (Signed)
[facility] EMERGENCY DEPARTMENT Provider Note  CSN: [id number] Arrival date & time: 09/09/20 2301  Chief Complaint(s) Chest Pain and Atrial Fibrillation  HPI Nicole Anderson is a 55 y.o. female   The history is provided by the patient.  Atrial Fibrillation This is a new problem. The current episode started 1 to 2 hours ago. The problem occurs constantly. The problem has not changed since onset.Associated symptoms include chest pain (substernal pressure, mild. started approx 15 min ago). Pertinent negatives include no abdominal pain, no headaches and no shortness of breath. Nothing aggravates the symptoms. Nothing relieves the symptoms. She has tried nothing for the symptoms.   No prior h/o AFib. No cardiac history.  Patient denies any recent medication use.  No recent fevers or infections.  No nausea or vomiting.  No diarrhea.  Denies alcohol use or illicit drug use.  Reports that she has been hydrating well.   She did report that for the past 2 days she has been feeling fatigued.  No other symptoms.  She denies prior history of DVT/PE.  No prolonged immobilization.  No OCP use.  Has a history of basal cell carcinoma that was resected with good margins.  Past Medical History Past Medical History:  Diagnosis Date   Family history of adverse reaction to anesthesia    mom with complications   There are no problems to display for this patient.  Home Medication(s) Prior to Admission medications   Medication Sig Start Date End Date Taking? Authorizing Provider  levonorgestrel (MIRENA) 20 MCG/24HR IUD by Intrauterine route.    [provider]                                                                                                                                    Past Surgical History Past Surgical History:  Procedure Laterality Date   ACHILLES TENDON SURGERY Bilateral    APPENDECTOMY     CHOLECYSTECTOMY     REPAIR EXTENSOR TENDON WITH METATARSAL  OSTEOTOMY AND OPEN REDUCTION IN Right 03/29/2020   Procedure: OPEN TREATMENT OF RIGHT FIFTH METATARSAL WITH REPAIR OF NONUNION, PERONEUS BREVIS TENOLYSIS;  Surgeon: Erle Crocker, MD;  Location: [place];  Service: Orthopedics;  Laterality: Right;  LENGTH OF SURGERY: 1.5 HOURS   ROTATOR CUFF REPAIR     Family History No family history on file.  Social History Social History   Tobacco Use   Smoking status: Former   Smokeless tobacco: Never  Substance Use Topics   Alcohol use: Never   Drug use: Never   Allergies Ketorolac tromethamine, Macrobid [nitrofurantoin], and Dilaudid [hydromorphone]  Review of Systems Review of Systems  Respiratory:  Negative for shortness of breath.   Cardiovascular:  Positive for chest pain (substernal pressure, mild. started approx 15 min ago).  Gastrointestinal:  Negative for abdominal pain.  Neurological:  Negative for headaches.  All other systems are reviewed and are  negative for acute change except as noted in the HPI  Physical Exam Vital Signs  I have reviewed the triage vital signs BP 129/71   Pulse 189  Temp 97.9 F (36.6 C)   Resp 19   SpO2 95%   Physical Exam Vitals reviewed.  Constitutional:      General: She is not in acute distress.    Appearance: She is well-developed. She is not diaphoretic.  HENT:     Head: Normocephalic and atraumatic.     Nose: Nose normal.  Eyes:     General: No scleral icterus.       Right eye: No discharge.        Left eye: No discharge.     Conjunctiva/sclera: Conjunctivae normal.     Pupils: Pupils are equal, round, and reactive to light.  Cardiovascular:     Rate and Rhythm: Regular rhythm. Tachycardia present.     Heart sounds: No murmur heard.   No friction rub. No gallop.  Pulmonary:     Effort: Pulmonary effort is normal. No respiratory distress.     Breath sounds: Normal breath sounds. No stridor. No rales.  Abdominal:     General: There is no distension.      Palpations: Abdomen is soft.     Tenderness: There is no abdominal tenderness.  Musculoskeletal:        General: No tenderness.     Cervical back: Normal range of motion and neck supple.  Skin:    General: Skin is warm and dry.     Findings: No erythema or rash.  Neurological:     Mental Status: She is alert and oriented to person, place, and time.    ED Results and Treatments Labs (all labs ordered are listed, but only abnormal results are displayed) Labs Reviewed  BASIC METABOLIC PANEL - Abnormal; Notable for the following components:      Result Value   Potassium 3.4 (*)    Glucose, Bld 141 (*)    All other components within normal limits  CBC - Abnormal; Notable for the following components:   WBC 13.8 (*)    All other components within normal limits  URINALYSIS, ROUTINE W REFLEX MICROSCOPIC - Abnormal; Notable for the following components:   Color, Urine STRAW (*)    APPearance HAZY (*)    All other components within normal limits  TROPONIN I (HIGH SENSITIVITY) - Abnormal; Notable for the following components:   Troponin I (High Sensitivity) 21 (*)    All other components within normal limits  TROPONIN I (HIGH SENSITIVITY) - Abnormal; Notable for the following components:   Troponin I (High Sensitivity) 34 (*)    All other components within normal limits  SARS CORONAVIRUS 2 (TAT 6-24 HRS)  MAGNESIUM  TSH  T4, FREE  EKG  EKG Interpretation  Date/Time:  Sunday September 09 2020 23:13:04 EDT Ventricular Rate:  186 PR Interval:    QRS Duration: 82 QT Interval:  280 QTC Calculation: 492 R Axis:   42 Text Interpretation: Atrial fibrillation with rapid ventricular response Marked ST abnormality, possible inferior subendocardial injury Abnormal ECG No old tracing to compare Confirmed by Addison Lank [phone]) on 09/09/2020 11:58:31 PM         Radiology DG Chest Portable 1 View  Result Date: 09/10/2020 CLINICAL DATA:  Atrial fibrillation and chest pain, initial encounter EXAM: PORTABLE CHEST 1 VIEW COMPARISON:  None. FINDINGS: The heart size and mediastinal contours are within normal limits. Both lungs are clear. The visualized skeletal structures are unremarkable. IMPRESSION: No active disease. Electronically Signed   By: Inez Catalina M.D.   On: 09/10/2020 00:33    Pertinent labs & imaging results that were available during my care of the patient were reviewed by me and considered in my medical decision making (see MDM for details).  Medications Ordered in ED Medications  sodium chloride 0.9 % bolus 1,000 mL (0 mLs Intravenous Stopped 09/10/20 0119)    Followed by  0.9 %  sodium chloride infusion (0 mLs Intravenous Stopped 09/10/20 0218)  potassium chloride 10 mEq in 100 mL IVPB (10 mEq Intravenous New Bag/Given 09/10/20 0217)                                                                                                                                     Procedures .1-3 Lead EKG Interpretation  Date/Time: 09/10/2020 3:43 AM Performed by: Fatima Blank, MD Authorized by: Fatima Blank, MD     Interpretation: normal     ECG rate:  103   ECG rate assessment: tachycardic     Rhythm: sinus tachycardia     Ectopy: PVCs     Conduction: normal   .Critical Care  Date/Time: 09/10/2020 3:44 AM Performed by: Fatima Blank, MD Authorized by: Fatima Blank, MD   Critical care provider statement:    Critical care time (minutes):  45   Critical care was necessary to treat or prevent imminent or life-threatening deterioration of the following conditions:  Cardiac failure   Critical care was time spent personally by me on the following activities:  Discussions with consultants, evaluation of patient's response to treatment, examination of patient, ordering and performing treatments and  interventions, ordering and review of laboratory studies, ordering and review of radiographic studies, pulse oximetry, re-evaluation of patient's condition, obtaining history from patient or surrogate and review of old charts   Care discussed with: admitting provider    (including critical care time)  Medical Decision Making / ED Course I have reviewed the nursing notes for this encounter and the patient's prior records (if available in EHR or on provided paperwork).  Nicole Anderson was evaluated in Emergency Department on 09/10/2020 for the symptoms described  in the history of present illness. She was evaluated in the context of the global COVID-19 pandemic, which necessitated consideration that the patient might be at risk for infection with the SARS-CoV-2 virus that causes COVID-19. Institutional protocols and algorithms that pertain to the evaluation of patients at risk for COVID-19 are in a state of rapid change based on information released by regulatory bodies including the CDC and federal and state organizations. These policies and algorithms were followed during the patient's care in the ED.     Patient presents with rapid heart rate  Chest discomfort began just prior to arrival. Patient seen in MSE process, noted to be in A. fib RVR. Brought back to a treatment room as soon as possible. During my evaluation, patient had just converted to normal sinus rhythm as she was being hooked up to the telemetry. EKG showed normal sinus rhythm without acute ischemic changes or evidence of pericarditis.   Screening labs obtain.  Chest x-ray ordered.  Provided with IV fluids.  We will continue to monitor. CHADsVAsC of 1.  Pertinent labs & imaging results that were available during my care of the patient were reviewed by me and considered in my medical decision making:  Chest x-ray without evidence of pneumonia, pneumothorax, pulmonary edema. CBC with leukocytosis no significant anemia.   Leukocytosis likely demargination from tachycardia.  Mild hypokalemia and hyperglycemia on BMP.  Mag within normal limits.   K repleated. Ordered UA to rule out infection - negative.  Thyroid panel wnl. Trop slightly elevated - likely due to demand from persistent tachycardia with HR>180s for 2 hrs, but will need to trend to rule out ACS. Delta trop from 21-->34. Will need to admit to continue to trend.    Final Clinical Impression(s) / ED Diagnoses Final diagnoses:  Atrial fibrillation with RVR (HCC)  Elevated troponin     This chart was dictated using voice recognition software.  Despite best efforts to proofread,  errors can occur which can change the documentation meaning.    Fatima Blank, MD 09/10/20 [phone]

## 2020-09-10 NOTE — H&P (Addendum)
History and Physical    Nicole Anderson [id number] DOB: August 31, 1965 DOA: 09/09/2020  PCP: Pcp, No  Patient coming from: Home   Chief Complaint:  Chief Complaint  Patient presents with   Chest Pain   Atrial Fibrillation     HPI:    55 year old female with no significant past medical history presenting to [facility] emergency department with complaints of chest discomfort dyspnea on exertion and palpitations.  Patient explains that for the past several days she has been experiencing mild dyspnea on exertion.  Patient denies any associated cough, fever, paroxysmal nocturnal dyspnea, or pillow orthopnea.  Yesterday evening at approximately 10:15 PM while patient was driving her car she developed sudden onset of severe palpitations with associated chest pain.  Patient describes the chest pain as pressure-like in quality, located in the left anterior chest.  Pain is nonradiating and associated with mild shortness of breath.    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  Upon initial arrival in the emergency department initial EKG and telemetry revealed patient was in rapid atrial fibrillation with initial EKG revealing heart rate 186 bpm.  Patient spontaneously converted to normal sinus rhythm without any pharmacologic intervention.  While patient was being monitored in the emergency department patient continued to complain of waxing and waning chest discomfort.  Troponins were also found to be slightly elevated at 21 and 34.  Potassium was found to be 3.4 and ER provider ordered 10 mill equivalents of intravenous potassium.  The hospitalist group was then called to assess the patient for admission to the hospital.   Review of Systems:   Review of Systems  Respiratory:  Positive for  shortness of breath.   Cardiovascular:  Positive for chest pain and palpitations.  All other systems reviewed and are negative.  Past Medical History:  Diagnosis Date   Family history of adverse reaction to anesthesia    mom with complications    Past Surgical History:  Procedure Laterality Date   ACHILLES TENDON SURGERY Bilateral    APPENDECTOMY     CHOLECYSTECTOMY     REPAIR EXTENSOR TENDON WITH METATARSAL OSTEOTOMY AND OPEN REDUCTION IN Right 03/29/2020   Procedure: OPEN TREATMENT OF RIGHT FIFTH METATARSAL WITH REPAIR OF NONUNION, PERONEUS BREVIS TENOLYSIS;  Surgeon: Erle Crocker, MD;  Location: [place];  Service: Orthopedics;  Laterality: Right;  LENGTH OF SURGERY: 1.5 HOURS   ROTATOR CUFF REPAIR       reports that she has quit smoking. She has never used smokeless tobacco. She reports that she does not drink alcohol and does not use drugs.  Allergies  Allergen Reactions   Ketorolac Tromethamine Rash   Macrobid [Nitrofurantoin] Rash   Dilaudid [Hydromorphone] Itching    Family History  Problem Relation Age of Onset   Heart attack Father    Atrial fibrillation Maternal Grandfather      Prior to Admission medications   Medication Sig Start Date End Date Taking? Authorizing Provider  levonorgestrel (MIRENA) 20 MCG/24HR IUD by Intrauterine route.    [provider]    Physical Exam: Vitals:   09/10/20 0045 09/10/20 0100 09/10/20 0115 09/10/20 0130  BP: 126/75 133/64 118/73 136/69  Pulse: 88 85 81 77  Resp: '15 11 12 '$ (!) 0  Temp:  SpO2: 95% 94% 95% 95%    Constitutional: Awake alert and oriented x3, no associated distress.   Skin: no rashes, no lesions, good skin turgor noted. Eyes: Pupils are equally reactive to light.  No evidence of scleral icterus or conjunctival pallor.  ENMT: Moist mucous membranes noted.  Posterior pharynx clear of any exudate or lesions.   Neck: normal, supple, no masses, no thyromegaly.  No evidence of  jugular venous distension.   Respiratory: clear to auscultation bilaterally, no wheezing, no crackles. Normal respiratory effort. No accessory muscle use.  Cardiovascular: Regular rate and rhythm, no murmurs / rubs / gallops. No extremity edema. 2+ pedal pulses. No carotid bruits.  Chest:   Nontender without crepitus or deformity.   Back:   Nontender without crepitus or deformity. Abdomen: Abdomen is soft and nontender.  No evidence of intra-abdominal masses.  Positive bowel sounds noted in all quadrants.   Musculoskeletal: No joint deformity upper and lower extremities. Good ROM, no contractures. Normal muscle tone.  Neurologic: CN 2-12 grossly intact. Sensation intact.  Patient moving all 4 extremities spontaneously.  Patient is following all commands.  Patient is responsive to verbal stimuli.   Psychiatric: Patient exhibits normal mood with appropriate affect.  Patient seems to possess insight as to their current situation.     Labs on Admission: I have personally reviewed following labs and imaging studies -   CBC: Recent Labs  Lab 09/10/20 0018  WBC 13.8*  HGB 14.2  HCT 43.9  MCV 93.6  PLT [id number]   Basic Metabolic Panel: Recent Labs  Lab 09/10/20 0018  NA 139  K 3.4*  CL 104  CO2 25  GLUCOSE 141*  BUN 16  CREATININE 0.94  CALCIUM 10.0  MG 2.0   GFR: CrCl cannot be calculated (Unknown ideal weight.). Liver Function Tests: No results for input(s): AST, ALT, ALKPHOS, BILITOT, PROT, ALBUMIN in the last 168 hours. No results for input(s): LIPASE, AMYLASE in the last 168 hours. No results for input(s): AMMONIA in the last 168 hours. Coagulation Profile: No results for input(s): INR, PROTIME in the last 168 hours. Cardiac Enzymes: No results for input(s): CKTOTAL, CKMB, CKMBINDEX, TROPONINI in the last 168 hours. BNP (last 3 results) No results for input(s): PROBNP in the last 8760 hours. HbA1C: No results for input(s): HGBA1C in the last 72 hours. CBG: No results for  input(s): GLUCAP in the last 168 hours. Lipid Profile: No results for input(s): CHOL, HDL, LDLCALC, TRIG, CHOLHDL, LDLDIRECT in the last 72 hours. Thyroid Function Tests: Recent Labs    09/10/20 0018  TSH 3.116  FREET4 0.85   Anemia Panel: No results for input(s): VITAMINB12, FOLATE, FERRITIN, TIBC, IRON, RETICCTPCT in the last 72 hours. Urine analysis:    Component Value Date/Time   COLORURINE STRAW (A) 09/10/2020 0153   APPEARANCEUR HAZY (A) 09/10/2020 0153   LABSPEC 1.008 09/10/2020 0153   PHURINE 7.0 09/10/2020 0153   GLUCOSEU NEGATIVE 09/10/2020 0153   HGBUR NEGATIVE 09/10/2020 0153   BILIRUBINUR NEGATIVE 09/10/2020 0153   KETONESUR NEGATIVE 09/10/2020 0153   PROTEINUR NEGATIVE 09/10/2020 0153   NITRITE NEGATIVE 09/10/2020 0153   LEUKOCYTESUR NEGATIVE 09/10/2020 0153    Radiological Exams on Admission - Personally Reviewed: CT Angio Chest Pulmonary Embolism (PE) W or WO Contrast  Result Date: 09/10/2020 CLINICAL DATA:  56 year old female with atrial fibrillation, chest pain, malaise. RVR. EXAM: CT ANGIOGRAPHY CHEST WITH CONTRAST TECHNIQUE: Multidetector CT imaging of the chest was performed using the standard protocol during bolus administration of  intravenous contrast. Multiplanar CT image reconstructions and MIPs were obtained to evaluate the vascular anatomy. CONTRAST:  73m OMNIPAQUE IOHEXOL 350 MG/ML SOLN COMPARISON:  Portable chest 0018 hours today. FINDINGS: Cardiovascular: Good contrast bolus timing in the pulmonary arterial tree. No focal filling defect identified in the pulmonary arteries to suggest acute pulmonary embolism. Cardiac size within normal limits. Some left coronary calcified atherosclerosis suspected on series 7, image 180. No pericardial effusion. Little contrast in the aorta which appears negative. Mediastinum/Nodes: Negative.  No lymphadenopathy. Lungs/Pleura: Somewhat low lung volumes with bilateral pulmonary ground-glass and more confluent dependent sub  solid pulmonary opacity most compatible with atelectasis. Mild mosaic attenuation in the upper lobe suggesting some gas trapping. Major airways remain patent. No pleural effusion or consolidation. Upper Abdomen: Small calcified granuloma in the left anterior pathic lobe. Absent gallbladder. Otherwise negative visible liver, spleen, pancreas, adrenal glands and bowel in the upper abdomen. Musculoskeletal: No acute osseous abnormality identified. Review of the MIP images confirms the above findings. IMPRESSION: 1. No evidence of acute pulmonary embolus. 2. Mild pulmonary atelectasis and gas trapping. No overt edema or pleural effusion. 3. Suspected left coronary artery atherosclerosis. Electronically Signed   By: HGenevie AnnM.D.   On: 09/10/2020 06:28   DG Chest Portable 1 View  Result Date: 09/10/2020 CLINICAL DATA:  Atrial fibrillation and chest pain, initial encounter EXAM: PORTABLE CHEST 1 VIEW COMPARISON:  None. FINDINGS: The heart size and mediastinal contours are within normal limits. Both lungs are clear. The visualized skeletal structures are unremarkable. IMPRESSION: No active disease. Electronically Signed   By: MInez CatalinaM.D.   On: 09/10/2020 00:33    EKG: Personally reviewed.  Rhythm is atrial fibrillation with heart rate of 186 bpm.  No dynamic ST segment changes appreciated.  Assessment/Plan Principal Problem:   Atrial fibrillation with rapid ventricular response ([facility]  Patient presenting with rather sudden onset of chest pain palpitations and shortness of breath yesterday at approximately 10 PM with spontaneous conversion back to sinus rhythm while being evaluated in the emergency department Etiology is not completely clear.  ER provider has already identified that TSH and magnesium are within normal limits.  Initial troponin obtained is slightly elevated however with second troponin rising somewhat, currently at 34. Considering patient's several day history of reported dyspnea on  exertion will additionally obtain CT angiogram of the chest to rule out pulmonary embolism. Monitoring patient on telemetry Continuing to cycle cardiac enzymes to establish peak Obtaining echocardiogram Chadsvasc score is 1 (female) and considering the brief incidence of atrial fibrillation we will abstain from initiation of full dose anticoagulation at this time. Considering patient's ongoing waxing waning chest discomfort despite being in normal sinus rhythm will obtain cardiology consultation, their input is appreciated.  Active Problems:   Chest pain with elevated troponin  Patient experienced significant chest discomfort at the time of onset of rapid atrial relation yesterday evening however despite conversion back to normal sinus rhythm patient continues to complain of waxing and waning chest discomfort throughout emergency department stay. Notable slightly elevated troponin which may simply be secondary to the bout of rapid atrial fibrillation Continuing to cycle cardiac enzymes to establish peak Obtaining echocardiogram Ruling out pulmonary embolism with CT angiogram of the chest Obtaining cardiology consultation to determine if noninvasive ischemic assessment is warranted. Initiating aspirin,.  As needed sublingual nitroglycerin    Leukocytosis  Mild leukocytosis on CBC Urinalysis unremarkable Chest x-ray reveals no evidence of pneumonia Possibly stress-induced    Hyperglycemia  Obtaining hemoglobin  A1c   Code Status:  Full code Family Communication: deferred   Status is: Observation  The patient remains OBS appropriate and will d/c before 2 midnights.  Dispo: The patient is from: Home              Anticipated d/c is to: Home              Patient currently is not medically stable to d/c.   Difficult to place patient No        Vernelle Emerald MD [facility] Pager [phone]  If 7PM-7AM, please contact night-coverage [email or web address] Use universal  [place] password for that web site. If you do not have the password, please call the hospital operator.  09/10/2020, 6:54 AM

## 2020-09-10 NOTE — ED Notes (Signed)
Patient up to use restroom, upon returning from restroom, patient visibly short of breath. Denies CP at this time.

## 2020-09-10 NOTE — Consult Note (Addendum)
Cardiology Consultation:   Patient ID: Nicole Anderson MRN: [id number]; DOB: [birth year]  Admit date: 09/09/2020 Date of Consult: 09/10/2020  PCP:  Merryl Hacker No   [facility] Providers Cardiologist:  New to Dr. Harrell Gave     Patient Profile:   Nicole Anderson is a 55 y.o. female with a no significant PMH who is being seen 09/10/2020 for the evaluation of chest pain and A fib RVR at the request of Dr. Cyd Silence.   History of Present Illness:   Nicole Anderson had no known previous cardiac disease.   She presented to ER 09/09/2020 evening c/o SOB with exertion for the past few days. She was driving yesterday around 10:15 PM, developed sudden onset of chest pressure feeling and heart palpitations. She states it was pressure like feeling, located in left side of her chest, non-radiating, associated with some SOB and feeling unwell. She checked her pulse and it was rapid and irregular. She therefore came to ER for evaluation. She states the episode lasted about 2 hours and resolved. She has a twinge sensation of her left sided chest currently. She states she had frequent PVCs 10 years ago and saw a cardiologist. She does have irregular heart beat sensations from time to time. She denied any exertional chest pain, syncope, dizziness, fever, chills, nausea, vomiting, diarrhea. She states her father had MI around age of 41s. She reports family hx of A fib. She denied any hx of bleeding.   Admission diagnostics revealed hypokalemia 3.4, otherwise BMP unremarkable.  CBC with leukocytosis with WBC 13 800.  TSH WNL.  Magnesium WNL.  High sensitive troponin 21>34>30.  Flu and COVID-19 negative.  HIV negative.  Urinalysis unremarkable.  Chest x-ray showed no acute finding.  CTA negative for PE, mild pulmonary atelectasis and gas trapping no overt edema or pleural effusion, suspected left coronary artery atherosclerosis.  Initial EKG on 09/09/2020 at 23:13 revealed A. fib with RVR, ventricular rate of 186, with associated  ST-T depression.  Repeat EKG on 09/10/2020 at 00:17 revealed conversion to sinus rhythm with ventricular rate of 94 bpm.  She has not required any AV nodal blocking agent and had spontaneous conversion at ED. She was subsequently admitted to hospital medicine service.  Echocardiogram grossly unremarkable.  Cardiology is asked to see the patient today for further input.   Past Medical History:  Diagnosis Date   Family history of adverse reaction to anesthesia    mom with complications    Past Surgical History:  Procedure Laterality Date   ACHILLES TENDON SURGERY Bilateral    APPENDECTOMY     CHOLECYSTECTOMY     REPAIR EXTENSOR TENDON WITH METATARSAL OSTEOTOMY AND OPEN REDUCTION IN Right 03/29/2020   Procedure: OPEN TREATMENT OF RIGHT FIFTH METATARSAL WITH REPAIR OF NONUNION, PERONEUS BREVIS TENOLYSIS;  Surgeon: Erle Crocker, MD;  Location: [facility];  Service: Orthopedics;  Laterality: Right;  LENGTH OF SURGERY: 1.5 HOURS   ROTATOR CUFF REPAIR       Home Medications:  Prior to Admission medications   Medication Sig Start Date End Date Taking? Authorizing Provider  levonorgestrel (MIRENA) 20 MCG/24HR IUD 1 each by Intrauterine route once.   Yes [provider]    Inpatient Medications: Scheduled Meds:  enoxaparin (LOVENOX) injection  40 mg Subcutaneous Daily   metoprolol tartrate  25 mg Oral BID   Continuous Infusions:  PRN Meds: acetaminophen, nitroGLYCERIN, ondansetron (ZOFRAN) IV  Allergies:    Allergies  Allergen Reactions   Ketorolac Tromethamine Rash  Macrobid [Nitrofurantoin] Rash   Dilaudid [Hydromorphone] Itching    Social History:   Social History   Socioeconomic History   Marital status: Married    Spouse name: Not on file   Number of children: Not on file   Years of education: Not on file   Highest education level: Not on file  Occupational History   Not on file  Tobacco Use   Smoking status: Former   Smokeless tobacco:  Never  Substance and Sexual Activity   Alcohol use: Never   Drug use: Never   Sexual activity: Not on file  Other Topics Concern   Not on file  Social History Narrative   Not on file   Social Determinants of Health   Financial Resource Strain: Not on file  Food Insecurity: Not on file  Transportation Needs: Not on file  Physical Activity: Not on file  Stress: Not on file  Social Connections: Not on file  Intimate Partner Violence: Not on file    Family History:    Family History  Problem Relation Age of Onset   Heart attack Father    Atrial fibrillation Maternal Grandfather      ROS:  Constitutional: Denied fever, chills, malaise, night sweats Eyes: Denied vision change or loss Ears/Nose/Mouth/Throat: Denied ear ache, sore throat, coughing, sinus pain Cardiovascular: see HPI  Respiratory: see HPI  Gastrointestinal: Denied nausea, vomiting, abdominal pain, diarrhea Genital/Urinary: Denied dysuria, hematuria, urinary frequency/urgency Musculoskeletal: Denied muscle ache, joint pain, weakness Skin: Denied rash, wound Neuro: Denied headache, dizziness, syncope Psych: Denied history of depression/anxiety  Endocrine: Denied history of diabetes   Physical Exam/Data:   Vitals:   09/10/20 0130 09/10/20 0732 09/10/20 1124 09/10/20 1236  BP: 136/69 (!) 153/51 (!) 143/62 (!) 127/52  Pulse: 77 66 64 66  Resp: (!) 0 '19 18 17  '$ Temp:  97.8 F (36.6 C)    SpO2: 95% 97% 100% 97%    Intake/Output Summary (Last 24 hours) at 09/10/2020 1449 Last data filed at 09/10/2020 0219 Gross per 24 hour  Intake 2000 ml  Output --  Net 2000 ml   Last 3 Weights 03/29/2020 03/26/2020 03/22/2020  Weight (lbs) 272 lb 4.3 oz 275 lb 12.7 oz 275 lb  Weight (kg) 123.5 kg 125.1 kg 124.739 kg     There is no height or weight on file to calculate BMI.   Vitals:  Vitals:   09/10/20 1124 09/10/20 1236  BP: (!) 143/62 (!) 127/52  Pulse: 64 66  Resp: 18 17  Temp:    SpO2: 100% 97%   General  Appearance: In no apparent distress, laying in bed HEENT: Normocephalic, atraumatic. EOMs intact. Oral cavity is dry and clear Neck: Supple, trachea midline, no JVDs Cardiovascular: Regular rate and rhythm, normal S1-S2,  no murmur/rub/gallop Respiratory: Resting breathing unlabored, lungs sounds clear to auscultation bilaterally, no use of accessory muscles. On room air.  No wheezes, rales or rhonchi.   Gastrointestinal: Bowel sounds positive, abdomen soft, non-tender, non-distended.  Extremities: Able to move all extremities in bed without difficulty, no edema/cyanosis/clubbing Genitourinary: genital exam not performed Musculoskeletal: Normal muscle bulk and tone, muscle strength 5/5 throughout Skin: Intact, warm, dry. No rashes or petechiae noted in exposed areas.  Neurologic: Alert, oriented to person, place and time. Fluent speech, no cognitive deficit,  no gross focal neuro deficit Psychiatric: Normal affect. Mood is appropriate.    EKG:  The EKG was personally reviewed and demonstrates:    Initial EKG on 09/09/2020 at 23:13 revealed  A. fib with RVR, ventricular rate of 186 bpm, with associated ST-T depression.    Repeat EKG on 09/10/2020 at 00:17 revealed conversion to sinus rhythm with ventricular rate of 94 bpm, no ST-T abnormalities.   No previous EKGs available to compare  Telemetry:  Telemetry was personally reviewed and demonstrates:  Sinus rhythm with rate of 70s   Relevant CV Studies:   Echocardiogram completed on 09/10/2020 revealed:   1. Left ventricular ejection fraction, by estimation, is 65 to 70%. The  left ventricle has normal function. The left ventricle has no regional  wall motion abnormalities. There is moderate concentric left ventricular  hypertrophy. Left ventricular diastolic parameters are consistent with Grade I diastolic dysfunction (impaired relaxation).   2. Right ventricular systolic function is normal. The right ventricular  size is normal. There is  mildly elevated pulmonary artery systolic  pressure.   3. The mitral valve is normal in structure. No evidence of mitral valve  regurgitation. No evidence of mitral stenosis.   4. The aortic valve is tricuspid. Aortic valve regurgitation is not  visualized. No aortic stenosis is present.   5. The inferior vena cava is normal in size with greater than 50%  respiratory variability, suggesting right atrial pressure of 3 mmHg.   Comparison(s): No prior Echocardiogram.    Laboratory Data:  High Sensitivity Troponin:   Recent Labs  Lab 09/10/20 0018 09/10/20 0121 09/10/20 0742  TROPONINIHS 21* 34* 30*     Chemistry Recent Labs  Lab 09/10/20 0018 09/10/20 0742  NA 139 139  K 3.4* 3.8  CL 104 108  CO2 25 23  GLUCOSE 141* 99  BUN 16 12  CREATININE 0.94 0.65  CALCIUM 10.0 8.6*  GFRNONAA >60 >60  ANIONGAP 10 8    Recent Labs  Lab 09/10/20 0742  ALBUMIN 3.2*   Hematology Recent Labs  Lab 09/10/20 0018 09/10/20 0742  WBC 13.8* 10.6*  RBC 4.69 4.13  HGB 14.2 12.5  HCT 43.9 38.9  MCV 93.6 94.2  MCH 30.3 30.3  MCHC 32.3 32.1  RDW 13.9 14.0  PLT 374 342   BNPNo results for input(s): BNP, PROBNP in the last 168 hours.  DDimer No results for input(s): DDIMER in the last 168 hours.   Radiology/Studies:  CT Angio Chest Pulmonary Embolism (PE) W or WO Contrast  Result Date: 09/10/2020 CLINICAL DATA:  55 year old female with atrial fibrillation, chest pain, malaise. RVR. EXAM: CT ANGIOGRAPHY CHEST WITH CONTRAST TECHNIQUE: Multidetector CT imaging of the chest was performed using the standard protocol during bolus administration of intravenous contrast. Multiplanar CT image reconstructions and MIPs were obtained to evaluate the vascular anatomy. CONTRAST:  53m OMNIPAQUE IOHEXOL 350 MG/ML SOLN COMPARISON:  Portable chest 0018 hours today. FINDINGS: Cardiovascular: Good contrast bolus timing in the pulmonary arterial tree. No focal filling defect identified in the pulmonary  arteries to suggest acute pulmonary embolism. Cardiac size within normal limits. Some left coronary calcified atherosclerosis suspected on series 7, image 180. No pericardial effusion. Little contrast in the aorta which appears negative. Mediastinum/Nodes: Negative.  No lymphadenopathy. Lungs/Pleura: Somewhat low lung volumes with bilateral pulmonary ground-glass and more confluent dependent sub solid pulmonary opacity most compatible with atelectasis. Mild mosaic attenuation in the upper lobe suggesting some gas trapping. Major airways remain patent. No pleural effusion or consolidation. Upper Abdomen: Small calcified granuloma in the left anterior pathic lobe. Absent gallbladder. Otherwise negative visible liver, spleen, pancreas, adrenal glands and bowel in the upper abdomen. Musculoskeletal: No acute osseous abnormality identified.  Review of the MIP images confirms the above findings. IMPRESSION: 1. No evidence of acute pulmonary embolus. 2. Mild pulmonary atelectasis and gas trapping. No overt edema or pleural effusion. 3. Suspected left coronary artery atherosclerosis. Electronically Signed   By: Genevie Ann M.D.   On: 09/10/2020 06:28   DG Chest Portable 1 View  Result Date: 09/10/2020 CLINICAL DATA:  Atrial fibrillation and chest pain, initial encounter EXAM: PORTABLE CHEST 1 VIEW COMPARISON:  None. FINDINGS: The heart size and mediastinal contours are within normal limits. Both lungs are clear. The visualized skeletal structures are unremarkable. IMPRESSION: No active disease. Electronically Signed   By: Inez Catalina M.D.   On: 09/10/2020 00:33   ECHOCARDIOGRAM COMPLETE  Result Date: 09/10/2020    ECHOCARDIOGRAM REPORT   Patient Name:   Nicole Anderson Date of Exam: 09/10/2020 Medical Rec #:  [id number]     Height:       66.0 in Accession #:    [id number]    Weight:       272.3 lb Date of Birth:  [birth year]     BSA:          2.280 m Patient Age:    70 years      BP:           153/51 mmHg Patient Gender: F              HR:           66 bpm. Exam Location:  Inpatient Procedure: 2D Nicole, Cardiac Doppler and Color Doppler Indications:    Atrial fibrillation  History:        Patient has no prior history of Echocardiogram examinations.                 Arrythmias:Atrial Fibrillation, Signs/Symptoms:Dyspnea and                 elevated troponin; Risk Factors:Obesity.  Sonographer:    Dustin Flock RDCS Referring Phys: [id number] [place]  1. Left ventricular ejection fraction, by estimation, is 65 to 70%. The left ventricle has normal function. The left ventricle has no regional wall motion abnormalities. There is moderate concentric left ventricular hypertrophy. Left ventricular diastolic parameters are consistent with Grade I diastolic dysfunction (impaired relaxation).  2. Right ventricular systolic function is normal. The right ventricular size is normal. There is mildly elevated pulmonary artery systolic pressure.  3. The mitral valve is normal in structure. No evidence of mitral valve regurgitation. No evidence of mitral stenosis.  4. The aortic valve is tricuspid. Aortic valve regurgitation is not visualized. No aortic stenosis is present.  5. The inferior vena cava is normal in size with greater than 50% respiratory variability, suggesting right atrial pressure of 3 mmHg. Comparison(s): No prior Echocardiogram. FINDINGS  Left Ventricle: Left ventricular ejection fraction, by estimation, is 65 to 70%. The left ventricle has normal function. The left ventricle has no regional wall motion abnormalities. The left ventricular internal cavity size was normal in size. There is  moderate concentric left ventricular hypertrophy. Left ventricular diastolic parameters are consistent with Grade I diastolic dysfunction (impaired relaxation). Right Ventricle: The right ventricular size is normal. No increase in right ventricular wall thickness. Right ventricular systolic function is normal. There is mildly  elevated pulmonary artery systolic pressure. The tricuspid regurgitant velocity is 2.99  m/s, and with an assumed right atrial pressure of 3 mmHg, the estimated right ventricular systolic pressure is [id number] mmHg. Left Atrium: Left atrial  size was normal in size. Right Atrium: Right atrial size was normal in size. Pericardium: There is no evidence of pericardial effusion. Mitral Valve: The mitral valve is normal in structure. No evidence of mitral valve regurgitation. No evidence of mitral valve stenosis. Tricuspid Valve: The tricuspid valve is normal in structure. Tricuspid valve regurgitation is trivial. No evidence of tricuspid stenosis. Aortic Valve: The aortic valve is tricuspid. Aortic valve regurgitation is not visualized. No aortic stenosis is present. Pulmonic Valve: The pulmonic valve was normal in structure. Pulmonic valve regurgitation is not visualized. No evidence of pulmonic stenosis. Aorta: The aortic root is normal in size and structure. Venous: The inferior vena cava is normal in size with greater than 50% respiratory variability, suggesting right atrial pressure of 3 mmHg. IAS/Shunts: The atrial septum is grossly normal.  LEFT VENTRICLE PLAX 2D LVIDd:         5.00 cm     Diastology LVIDs:         3.10 cm     LV e' medial:    8.16 cm/s LV PW:         1.30 cm     LV E/e' medial:  11.0 LV IVS:        1.20 cm     LV e' lateral:   9.36 cm/s LVOT diam:     2.10 cm     LV E/e' lateral: 9.6 LV SV:         93 LV SV Index:   41 LVOT Area:     3.46 cm  LV Volumes (MOD) LV vol d, MOD A4C: 99.6 ml LV vol s, MOD A4C: 36.4 ml LV SV MOD A4C:     99.6 ml RIGHT VENTRICLE RV Basal diam:  3.20 cm RV S prime:     14.90 cm/s TAPSE (M-mode): 2.1 cm LEFT ATRIUM             Index       RIGHT ATRIUM           Index LA diam:        4.00 cm 1.75 cm/m  RA Area:     11.80 cm LA Vol (A2C):   38.8 ml 17.02 ml/m RA Volume:   24.40 ml  10.70 ml/m LA Vol (A4C):   52.5 ml 23.03 ml/m LA Biplane Vol: 47.7 ml 20.92 ml/m  AORTIC  VALVE LVOT Vmax:   120.00 cm/s LVOT Vmean:  90.800 cm/s LVOT VTI:    0.269 m  AORTA Ao Root diam: 3.20 cm MITRAL VALVE               TRICUSPID VALVE MV Area (PHT): 3.76 cm    TR Peak grad:   35.8 mmHg MV Decel Time: 202 msec    TR Vmax:        299.00 cm/s MV E velocity: 89.70 cm/s MV A velocity: 68.90 cm/s  SHUNTS MV E/A ratio:  1.30        Systemic VTI:  0.27 m                            Systemic Diam: 2.10 cm Nicole Haskell MD Electronically signed by Nicole Haskell MD Signature Date/Time: 09/10/2020/12:29:21 PM    Final      Assessment and Plan:   Chest pain Elevated troponin -Presented with few days of DOE, chest pain, heart palpitations - HS troponin 21>34 >30 - EKG initially revealed A. fib RVR ,  followed by spontaneous conversion to sinus rhythm - CTA of chest from today showed some left coronary calcified atherosclerosis - Nicole showed EF 65 to 70%, no RMWA, mildly elevated PASP with RVSP 38.8 mmHg, no significant valvular disease - ACS ruled out, recommend further ischemic work-up with stress myoview or CT coronary study, but this can be done outpatient in a non-urgent manner   Paroxysmal A. fib with RVR - EKG initially revealed A. fib RVR , followed by spontaneous conversion to sinus rhythm - TSH WNL -Currently remained in SR  -Will initiate p.o. metoprolol 12.'5mg'$  BID -Optimize electrolytes to maintain K >4 and Mag >2 -Ischemic evaluation recommended as above -CHA2DS2-VASc score 1 due to gender, discussed options of anticoagulation, will defer to MD having further discussion with the patient       Risk Assessment/Risk Scores:   HEAR Score (for undifferentiated chest pain):  HEAR Score: 2{   New York Heart Association (NYHA) Functional Class NYHA Class II  CHA2DS2-VASc Score = 1  This indicates a 0.6% annual risk of stroke. The patient's score is based upon: CHF History: No HTN History: No Diabetes History: No Stroke History: No Vascular Disease History:  No Age Score: 0 Gender Score: 1         For questions or updates, please contact [place] Please consult [email or web address] for contact info under    Signed, Margie Billet, NP  09/10/2020 2:49 PM

## 2020-09-17 ENCOUNTER — Telehealth (HOSPITAL_COMMUNITY): Payer: Self-pay | Admitting: Emergency Medicine

## 2020-09-17 NOTE — Telephone Encounter (Signed)
Attempted to call patient regarding upcoming cardiac CT appointment. °Left message on voicemail with name and callback number °Sharnita Bogucki RN Navigator Cardiac Imaging °[facility] °[phone] Office °[phone] Cell ° °

## 2020-09-18 ENCOUNTER — Telehealth (HOSPITAL_COMMUNITY): Payer: Self-pay | Admitting: *Deleted

## 2020-09-18 NOTE — Telephone Encounter (Signed)
Returning patient's call regarding upcoming cardiac imaging study; pt verbalizes understanding of appt date/time, parking situation and where to check in, pre-test NPO status and medications ordered, and verified current allergies; name and call back number provided for further questions should they arise  Gordy Clement RN Navigator Cardiac Imaging [facility] [phone] office [phone] cell  Patient to take '25mg'$  metoprolol tartrate two hours prior to cardiac CT.

## 2020-09-19 ENCOUNTER — Other Ambulatory Visit: Payer: Self-pay

## 2020-09-19 ENCOUNTER — Ambulatory Visit (HOSPITAL_COMMUNITY)
Admission: RE | Admit: 2020-09-19 | Discharge: 2020-09-19 | Disposition: A | Discharge status: Home / Self Care | Payer: 59 | Source: Ambulatory Visit | Attending: Cardiology | Admitting: Cardiology

## 2020-09-19 ENCOUNTER — Encounter (HOSPITAL_COMMUNITY): Payer: Self-pay

## 2020-09-19 DIAGNOSIS — R002 Palpitations: Secondary | ICD-10-CM | POA: Insufficient documentation

## 2020-09-19 DIAGNOSIS — R778 Other specified abnormalities of plasma proteins: Secondary | ICD-10-CM | POA: Insufficient documentation

## 2020-09-19 MED ORDER — NITROGLYCERIN 0.4 MG SL SUBL: 0.8000 mg | SUBLINGUAL_TABLET | Freq: Once | SUBLINGUAL | Status: AC

## 2020-09-19 MED ORDER — IOHEXOL 350 MG/ML SOLN
95.0000 mL | Freq: Once | INTRAVENOUS | Status: AC | PRN
  Administered 2020-09-19: 95 mL via INTRAVENOUS

## 2020-09-19 MED ORDER — NITROGLYCERIN 0.4 MG SL SUBL
SUBLINGUAL_TABLET | SUBLINGUAL | Status: AC
  Administered 2020-09-19: 0.8 mg via SUBLINGUAL
  Filled 2020-09-19: qty 2

## 2020-09-26 ENCOUNTER — Ambulatory Visit (HOSPITAL_BASED_OUTPATIENT_CLINIC_OR_DEPARTMENT_OTHER): Payer: 59 | Admitting: Cardiology

## 2020-09-26 ENCOUNTER — Other Ambulatory Visit: Payer: Self-pay

## 2020-09-26 ENCOUNTER — Encounter (HOSPITAL_BASED_OUTPATIENT_CLINIC_OR_DEPARTMENT_OTHER): Payer: Self-pay | Admitting: Cardiology

## 2020-09-26 VITALS — BP 122/78 | HR 58 | Ht 66.0 in | Wt 283.4 lb

## 2020-09-26 DIAGNOSIS — E7800 Pure hypercholesterolemia, unspecified: Secondary | ICD-10-CM

## 2020-09-26 DIAGNOSIS — I480 Paroxysmal atrial fibrillation: Secondary | ICD-10-CM | POA: Diagnosis not present

## 2020-09-26 DIAGNOSIS — R7302 Impaired glucose tolerance (oral): Secondary | ICD-10-CM

## 2020-09-26 DIAGNOSIS — I2510 Atherosclerotic heart disease of native coronary artery without angina pectoris: Secondary | ICD-10-CM

## 2020-09-26 DIAGNOSIS — Z7189 Other specified counseling: Secondary | ICD-10-CM | POA: Diagnosis not present

## 2020-09-26 MED ORDER — ASPIRIN EC 81 MG PO TBEC: 81.0000 mg | DELAYED_RELEASE_TABLET | Freq: Every day | ORAL | 3 refills | Status: AC

## 2020-09-26 MED ORDER — ROSUVASTATIN CALCIUM 20 MG PO TABS
20.0000 mg | ORAL_TABLET | Freq: Every day | ORAL | 3 refills | Status: DC
  Filled 2020-10-30: qty 90, 90d supply, fill #0
  Filled 2021-02-12: qty 90, 90d supply, fill #1
  Filled 2021-05-20: qty 90, 90d supply, fill #2
  Filled 2021-09-02: qty 60, 60d supply, fill #3

## 2020-09-26 NOTE — Patient Instructions (Signed)
Medication Instructions:  1.Start Aspirin '81mg'$ , take 1 tablet by mouth daily 2.Start rosuvastatin (Crestor) '20mg'$ , take 1 tablet by mouth daily *If you need a refill on your cardiac medications before your next appointment, please call your pharmacy*   Lab Work: Lipids and Lft's in 2-3 months If you have labs (blood work) drawn today and your tests are completely normal, you will receive your results only by: [place] (if you have MyChart) OR A paper copy in the mail If you have any lab test that is abnormal or we need to change your treatment, we will call you to review the results.   Testing/Procedures: None   Follow-Up: At [facility], you and your health needs are our priority.  As part of our continuing mission to provide you with exceptional heart care, we have created designated Provider Care Teams.  These Care Teams include your primary Cardiologist (physician) and Advanced Practice Providers (APPs -  Physician Assistants and Nurse Practitioners) who all work together to provide you with the care you need, when you need it.   Your next appointment:   6 month(s)  The format for your next appointment:   In Person  Provider:   Buford Dresser, MD

## 2020-09-26 NOTE — Progress Notes (Signed)
Cardiology Office Note:    Date:  09/26/2020   ID:  Nicole Anderson, DOB 1965-08-06, MRN [id number]  PCP:  Pcp, No  Cardiologist:  Buford Dresser, MD  Referring MD: Aline August, MD   CC: follow up   History of Present Illness:    Nicole Anderson is a 55 y.o. female with a hx of atrial fibrillation, chest discomfort who is seen for follow up. I met her in the hospital 09/10/20 when she presented with atrial fibrillation with RVR.  Today: We reviewed in detail the results of her cardiac CTA 08/2020, which revealed a calcium score of 154.There is nonobstructive coronary disease in her proximal LAD. We reviewed what this means and recommendations for management, see below.  Her blood pressure is elevated in clinic today, which she attributes to an hour-long drive to get here. Of note, she had preeclampsia during both of her pregnancies.  Since her last visit, she denies any recurrent episodes of atrial fibrillation. There have been no issues with metoprolol, and she has not needed to use nitroglycerin lately. Previously she was on baby aspirin following her bilateral achilles tendon surgery, and tolerated it well.  If she is standing on her feet for a long while, she will notice some bilateral LE edema.  In November 2021 she fell at work, and subsequently underwent open reduction of her right fifth metatarsal.  Her father had a heart attack and hyperlipidemia. Her paternal grandmother had hyperlipidemia. Her grandfather had atrial fibrillation and a stroke.  She denies any palpitations, chest pain, or shortness of breath. No lightheadedness, headaches, syncope, orthopnea, or PND.    Past Medical History:  Diagnosis Date   Family history of adverse reaction to anesthesia    mom with complications    Past Surgical History:  Procedure Laterality Date   ACHILLES TENDON SURGERY Bilateral    APPENDECTOMY     CHOLECYSTECTOMY     REPAIR EXTENSOR TENDON WITH METATARSAL OSTEOTOMY  AND OPEN REDUCTION IN Right 03/29/2020   Procedure: OPEN TREATMENT OF RIGHT FIFTH METATARSAL WITH REPAIR OF NONUNION, PERONEUS BREVIS TENOLYSIS;  Surgeon: Erle Crocker, MD;  Location: [place];  Service: Orthopedics;  Laterality: Right;  LENGTH OF SURGERY: 1.5 HOURS   ROTATOR CUFF REPAIR      Current Medications: Current Outpatient Medications on File Prior to Visit  Medication Sig   levonorgestrel (MIRENA) 20 MCG/24HR IUD 1 each by Intrauterine route once.   metoprolol tartrate (LOPRESSOR) 25 MG tablet Take 0.5 tablets (12.5 mg total) by mouth 2 (two) times daily.   nitroGLYCERIN (NITROSTAT) 0.4 MG SL tablet Place 1 tablet (0.4 mg total) under the tongue every 5 (five) minutes as needed for chest pain.   No current facility-administered medications on file prior to visit.     Allergies:   Ketorolac tromethamine, Macrobid [nitrofurantoin], and Dilaudid [hydromorphone]   Social History   Tobacco Use   Smoking status: Former   Smokeless tobacco: Never  Substance Use Topics   Alcohol use: Never   Drug use: Never    Family History: family history includes Atrial fibrillation in her maternal grandfather; Heart attack in her father.  ROS:   Please see the history of present illness.  Additional pertinent ROS: Constitutional: Negative for chills, fever, night sweats, unintentional weight loss  HENT: Negative for ear pain and hearing loss.   Eyes: Negative for loss of vision and eye pain.  Respiratory: Negative for cough, sputum, wheezing.   Cardiovascular: See HPI. Gastrointestinal:  Negative for abdominal pain, melena, and hematochezia.  Genitourinary: Negative for dysuria and hematuria.  Musculoskeletal: Negative for falls and myalgias.  Skin: Negative for itching and rash.  Neurological: Negative for focal weakness, focal sensory changes and loss of consciousness.  Endo/Heme/Allergies: Does not bruise/bleed easily.     EKGs/Labs/Other Studies Reviewed:     The following studies were reviewed today:  Cardiac CTA 09/19/2020: FINDINGS: Non-cardiac: See separate report from [facility].   Pulmonary veins drain normally to the left atrium. No LA appendage thrombus noted.   Calcium Score: 154 Agatston units.   Coronary Arteries: Right dominant with no anomalies   LM: No plaque or stenosis.   LAD system: Proximal mixed plaque with mild (<50%) stenosis.   Circumflex system: Small vessel, no plaque or stenosis.   RCA system: No plaque or stenosis.   IMPRESSION: 1. Coronary artery calcium score 154 Agatston units. This places the patient in the 97th percentile for age and gender, suggesting high risk for future cardiac events.   2.  Nonobstructive proximal LAD stenosis.  Echo 09/10/2020:  1. Left ventricular ejection fraction, by estimation, is 65 to 70%. The  left ventricle has normal function. The left ventricle has no regional  wall motion abnormalities. There is moderate concentric left ventricular  hypertrophy. Left ventricular  diastolic parameters are consistent with Grade I diastolic dysfunction  (impaired relaxation).   2. Right ventricular systolic function is normal. The right ventricular  size is normal. There is mildly elevated pulmonary artery systolic  pressure.   3. The mitral valve is normal in structure. No evidence of mitral valve  regurgitation. No evidence of mitral stenosis.   4. The aortic valve is tricuspid. Aortic valve regurgitation is not  visualized. No aortic stenosis is present.   5. The inferior vena cava is normal in size with greater than 50%  respiratory variability, suggesting right atrial pressure of 3 mmHg.   Comparison(s): No prior Echocardiogram.    EKG:  EKG is personally reviewed.   09/26/2020: sinus bradycardia at 58 bpm, PRWP  Recent Labs: 09/10/2020: BUN 12; Creatinine, Ser 0.65; Hemoglobin 12.5; Magnesium 1.9; Platelets 342; Potassium 3.8; Sodium 139; TSH 3.116  Recent  Lipid Panel    Component Value Date/Time   CHOL 208 (H) 09/10/2020 0742   TRIG 101 09/10/2020 0742   HDL 42 09/10/2020 0742   CHOLHDL 5.0 09/10/2020 0742   VLDL 20 09/10/2020 0742   LDLCALC 146 (H) 09/10/2020 0742    Physical Exam:    VS:  BP 122/78 (BP Location: Right Arm, Patient Position: Sitting)   Pulse (!) 58   Ht _0  (1.676 m)   Wt 283 lb 6.4 oz (128.5 kg)   SpO2 97%   BMI 45.74 kg/m     Wt Readings from Last 3 Encounters:  09/26/20 283 lb 6.4 oz (128.5 kg)  03/29/20 272 lb 4.3 oz (123.5 kg)    GEN: Well nourished, well developed in no acute distress HEENT: Normal, moist mucous membranes NECK: No JVD CARDIAC: regular rhythm, normal S1 and S2, no rubs or gallops. No murmur. VASCULAR: Radial and DP pulses 2+ bilaterally. No carotid bruits RESPIRATORY:  Clear to auscultation without rales, wheezing or rhonchi  ABDOMEN: Soft, non-tender, non-distended MUSCULOSKELETAL:  Ambulates independently SKIN: Warm and dry, no edema NEUROLOGIC:  Alert and oriented x 3. No focal neuro deficits noted. PSYCHIATRIC:  Normal affect    ASSESSMENT:    1. Paroxysmal atrial fibrillation (HCC)   2. Impaired glucose tolerance  3. Nonobstructive atherosclerosis of coronary artery   4. Hypercholesterolemia   5. Cardiac risk counseling   6. Counseling on health promotion and disease prevention    PLAN:    Paroxysmal atrial fibrillation -with RVR event 09/10/20 -no further events -chadsvasc=1, not on anticoagulation -tolerating metoprolol well -discussed when to call office/present to ER.   Nonobstructive CAD Hypercholesterolemia Obesity, BMI 45 Impaired glucose tolerance -reviewed CT results, calcium score today -discussed recommendations for management. After shared decision making, will start aspirin and rosuvastatin today -recheck lipids and LFTs in 2 mos -her A1c is 6.1, consistent with abnormal glucose handling/prediabetes. She is obese and has CAD. She would benefit  from weight loss. She is working on diet and exercise. We discussed GLP1RA, and she is interested. Will ask our pharmacy team to see if this would be covered on her insurance given her comorbidities -reviewed red flag warning signs that need immediate medical attention  Cardiac risk counseling and prevention recommendations: -recommend heart healthy/Mediterranean diet, with whole grains, fruits, vegetable, fish, lean meats, nuts, and olive oil. Limit salt. -recommend moderate walking, 3-5 times/week for 30-50 minutes each session. Aim for at least 150 minutes.week. Goal should be pace of 3 miles/hours, or walking 1.5 miles in 30 minutes -recommend avoidance of tobacco products. Avoid excess alcohol.  Plan for follow up: 6 months or sooner as needed.  Buford Dresser, MD, PhD, [facility]    Medication Adjustments/Labs and Tests Ordered: Current medicines are reviewed at length with the patient today.  Concerns regarding medicines are outlined above.   Orders Placed This Encounter  Procedures   Lipid panel   Hepatic function panel   EKG 12-Lead    Meds ordered this encounter  Medications   rosuvastatin (CRESTOR) 20 MG tablet    Sig: Take 1 tablet (20 mg total) by mouth daily.    Dispense:  90 tablet    Refill:  3   aspirin EC 81 MG tablet    Sig: Take 1 tablet (81 mg total) by mouth daily. Swallow whole.    Dispense:  90 tablet    Refill:  3    Patient Instructions  Medication Instructions:  1.Start Aspirin 71m, take 1 tablet by mouth daily 2.Start rosuvastatin (Crestor) 261m take 1 tablet by mouth daily *If you need a refill on your cardiac medications before your next appointment, please call your pharmacy*   Lab Work: Lipids and Lft's in 2-3 months If you have labs (blood work) drawn today and your tests are completely normal, you will receive your results only by: [facility] you have MyChart) OR A paper copy in the mail If you  have any lab test that is abnormal or we need to change your treatment, we will call you to review the results.   Testing/Procedures: None   Follow-Up: [facility].  As part of our continuing mission to provide you with exceptional heart care, we have created designated Provider Care Teams.  These Care Teams include your primary Cardiologist (physician) and Advanced Practice Providers (APPs -  Physician Assistants and Nurse Practitioners) who all work together to provide you with the care you need, when you need it.   Your next appointment:   6 month(s)  The format for your next appointment:   In Person  Provider:   BrBuford DresserMD       I,[facility],acting as a scribe for BrBuford DresserMD.,have documented  all relevant documentation on the behalf of Buford Dresser, MD,as directed by  Buford Dresser, MD while in the presence of Buford Dresser, MD.  I, Buford Dresser, MD, have reviewed all documentation for this visit. The documentation on 09/26/20 for the exam, diagnosis, procedures, and orders are all accurate and complete.   Signed, Buford Dresser, MD PhD 09/26/2020 11:59 AM    [place]

## 2020-10-02 ENCOUNTER — Telehealth: Payer: Self-pay | Admitting: Student-PharmD

## 2020-10-02 DIAGNOSIS — L578 Other skin changes due to chronic exposure to nonionizing radiation: Secondary | ICD-10-CM | POA: Diagnosis not present

## 2020-10-02 DIAGNOSIS — L820 Inflamed seborrheic keratosis: Secondary | ICD-10-CM | POA: Diagnosis not present

## 2020-10-02 DIAGNOSIS — L814 Other melanin hyperpigmentation: Secondary | ICD-10-CM | POA: Diagnosis not present

## 2020-10-02 DIAGNOSIS — Z85828 Personal history of other malignant neoplasm of skin: Secondary | ICD-10-CM | POA: Diagnosis not present

## 2020-10-02 DIAGNOSIS — Z08 Encounter for follow-up examination after completed treatment for malignant neoplasm: Secondary | ICD-10-CM | POA: Diagnosis not present

## 2020-10-02 NOTE — Telephone Encounter (Signed)
Patient returned call. Expressed interest in [place] treatment. Explained Wegovy/Ozempic shortage, need for Saxenda for the first 6 weeks. She states she is ok with daily injections for this time period. She does not have any personal or family history of medullary thyroid carcinoma, so will proceed with submitting PA for Saxenda.

## 2020-10-02 NOTE — Telephone Encounter (Signed)
Called patient to discuss interest in starting GLP1RA for weight loss. Given Wegovy and Ozempic shortages at starting doses, will plan to start [place] if patient is willing given no shortage issues with this medication. The titration period of Saxenda is 6 weeks of daily injections, titrating up the dose weekly, then would switch to [facility] 1.7 mg weekly x 4 weeks, then 2.4 mg weekly. No contraindications to GLP1RA use in the chart. Need to confirm patient does not have personal or family history of medullary thyroid carcinoma. If patient is interested, will submit PA for Saxenda.   Unable to reach patient, left voicemail requesting call back.     ----- Message -----  From: Buford Dresser, MD  Sent: 09/26/2020  12:00 PM EDT  To: Rebec[facility] Alert Pharmd  Subject: [id number]                                         Hey team, this patient doesn't have a diabetes diagnosis but does have abnormal A1c and lots of risk factors, including CAD. She is very interested in [facility] for weight loss. Thanks!!

## 2020-10-03 MED ORDER — SAXENDA 18 MG/3ML ~~LOC~~ SOPN: 0.6000 mg | PEN_INJECTOR | Freq: Every day | SUBCUTANEOUS | 1 refills | Status: DC

## 2020-10-03 NOTE — Telephone Encounter (Addendum)
Patient called to follow up on Saxenda PA, which was just approved. Have sent this in to her pharmacy. Reviewed administration instructions and dose titration with patient and she confirmed understanding. After 6 weeks of treatment if titration goes as planned, will then switch to [facility] 1.7 mg. Also informed her of copay card for Saxenda that she can sign up for online if cost of prescription at her pharmacy is >$25/month.   Will call her in 4 weeks to assess tolerability/titration and send in [facility] PA/Rx at that time.

## 2020-10-09 ENCOUNTER — Telehealth: Payer: Self-pay | Admitting: Student-PharmD

## 2020-10-09 ENCOUNTER — Telehealth: Payer: Self-pay | Admitting: Cardiology

## 2020-10-09 DIAGNOSIS — R7302 Impaired glucose tolerance (oral): Secondary | ICD-10-CM

## 2020-10-09 MED ORDER — PEN NEEDLES 32G X 4 MM MISC: 1 refills | Status: DC

## 2020-10-09 MED ORDER — METOPROLOL TARTRATE 25 MG PO TABS
12.5000 mg | ORAL_TABLET | Freq: Two times a day (BID) | ORAL | 1 refills | Status: DC
  Filled 2020-10-10: qty 30, 30d supply, fill #0
  Filled 2020-11-07: qty 30, 30d supply, fill #1

## 2020-10-09 NOTE — Telephone Encounter (Signed)
*  STAT* If patient is at the pharmacy, call can be transferred to refill team.   1. Which medications need to be refilled? (please list name of each medication and dose if known)  metoprolol tartrate (LOPRESSOR) 25 MG tablet  2. Which pharmacy/location (including street and city if local pharmacy) is medication to be sent to? [place], [place]  3. Do they need a 30 day or 90 day supply? [place]

## 2020-10-09 NOTE — Telephone Encounter (Signed)
Sent in a prescription for needles to inject Saxenda to her pharmacy. Called and spoke with patient to let her know and apologize for not sending this in prior.

## 2020-10-09 NOTE — Telephone Encounter (Signed)
New Message:     Patient wanted you to know when she picked up the medicine it was missing the needles. The pharmacist says she will need a prescription for the needles.

## 2020-10-10 ENCOUNTER — Other Ambulatory Visit (HOSPITAL_COMMUNITY): Payer: Self-pay

## 2020-10-30 ENCOUNTER — Other Ambulatory Visit (HOSPITAL_COMMUNITY): Payer: Self-pay

## 2020-11-06 ENCOUNTER — Telehealth: Payer: Self-pay | Admitting: Student-PharmD

## 2020-11-06 NOTE — Telephone Encounter (Signed)
Called patient to see how she is doing on Saxenda. If she started on 9/13 when she picked up her prescription, she should be close to titrating up to max dose of 3 mg if tolerating titration and increasing dose each week. If tolerating Saxenda well so far, once she has titrated up to and completed two weeks total of 3 mg daily dose, will then switch to [facility] 1.7 mg weekly. Will need to submit a PA for [facility] at that time.   Unable to reach patient, left voicemail requesting call back.

## 2020-11-07 ENCOUNTER — Other Ambulatory Visit (HOSPITAL_COMMUNITY): Payer: Self-pay

## 2020-11-07 NOTE — Telephone Encounter (Signed)
PA has been submitted for [facility].

## 2020-11-07 NOTE — Telephone Encounter (Signed)
Patient returned call. She states she is doing well on Saxenda. She gave her first injection of 2.4mg  today. She will increase to 3mg  next Wed. She is aware that she will give 3mg  daily x 2 weeks then switch to [facility] on 11/2. We will do PA for [facility] and let patient know when approved/sent to pharmacy.

## 2020-11-12 ENCOUNTER — Other Ambulatory Visit (HOSPITAL_COMMUNITY): Payer: Self-pay

## 2020-11-12 MED ORDER — SEMAGLUTIDE-WEIGHT MANAGEMENT 2.4 MG/0.75ML ~~LOC~~ SOAJ: 2.4000 mg | SUBCUTANEOUS | 11 refills | Status: DC

## 2020-11-12 MED ORDER — SEMAGLUTIDE-WEIGHT MANAGEMENT 2.4 MG/0.75ML ~~LOC~~ SOAJ
2.4000 mg | SUBCUTANEOUS | 11 refills | Status: DC
Start: 2021-03-08 — End: 2021-10-10
  Filled 2020-11-12 – 2020-12-24 (×2): qty 3, 28d supply, fill #0
  Filled 2021-01-15: qty 3, 28d supply, fill #1
  Filled 2021-02-12 – 2021-02-14 (×2): qty 3, 28d supply, fill #2
  Filled 2021-03-14: qty 3, 28d supply, fill #3
  Filled 2021-04-09: qty 3, 28d supply, fill #4
  Filled 2021-05-13: qty 3, 28d supply, fill #5
  Filled 2021-06-06: qty 3, 28d supply, fill #6
  Filled 2021-07-08: qty 3, 28d supply, fill #7
  Filled 2021-07-29: qty 3, 28d supply, fill #8
  Filled 2021-08-27: qty 3, 28d supply, fill #9
  Filled 2021-09-06: qty 3, 28d supply, fill #10
  Filled 2021-10-04: qty 3, 28d supply, fill #11

## 2020-11-12 MED ORDER — SEMAGLUTIDE-WEIGHT MANAGEMENT 1.7 MG/0.75ML ~~LOC~~ SOAJ: 1.7000 mg | SUBCUTANEOUS | 0 refills | Status: DC

## 2020-11-12 NOTE — Telephone Encounter (Signed)
[place] PA approved through 02/08/21.   This has been sent in to her pharmacy. She is due to switch to [facility] 1.7 mg weekly from [place] on 11/2. Then after 4 weeks will increase to [facility] 2.4 mg weekly. Will plan for follow up in 1 month by phone to assess tolerability and 3 month follow up office visit in December.   Called patient to inform her of the above. Unable to reach, left voicemail requesting call back.

## 2020-11-12 NOTE — Telephone Encounter (Signed)
Patient returned call. She would like the 1.7mg  dose to be sent to her regular pharmacy but to send the 2.4 mg dose to [facility] - this has been corrected. We discussed the difference in administration with Wegovy than Saxenda and she confirms understanding. She is due to start Wegovy 1.7 mg on 11/2. I will follow up with her by phone a few weeks after that then plan to have her back in clinic in December .

## 2020-11-20 ENCOUNTER — Other Ambulatory Visit (HOSPITAL_COMMUNITY): Payer: Self-pay

## 2020-11-27 ENCOUNTER — Telehealth: Payer: Self-pay | Admitting: Student-PharmD

## 2020-11-27 NOTE — Telephone Encounter (Signed)
Received call from patient who left voicemail stating that she finished Saxenda yesterday 10/31 and was wondering if she could go ahead and start [facility] today 11/1 or if she needed to wait until 11/2. She can go ahead and start taking Wegovy today instead of waiting for tomorrow and remember that it is once weekly instead of daily. Returned call but was unable to reach her, left voicemail requesting call back.

## 2020-11-27 NOTE — Telephone Encounter (Signed)
Patient returned call, stated that she went ahead and started [facility] today which is the same as I would have counseled her to do. Reminded her of once weekly dosing and that each pen is one dose since this is different from [place]. She thanks me for the call back and confirms understanding.

## 2020-12-11 ENCOUNTER — Other Ambulatory Visit: Payer: Self-pay | Admitting: Cardiology

## 2020-12-11 ENCOUNTER — Other Ambulatory Visit (HOSPITAL_COMMUNITY): Payer: Self-pay

## 2020-12-11 MED ORDER — METOPROLOL TARTRATE 25 MG PO TABS
12.5000 mg | ORAL_TABLET | Freq: Two times a day (BID) | ORAL | 1 refills | Status: DC
  Filled 2020-12-11: qty 90, 90d supply, fill #0
  Filled 2021-04-01: qty 90, 90d supply, fill #1

## 2020-12-11 NOTE — Telephone Encounter (Signed)
Rx request sent to pharmacy.  

## 2020-12-18 ENCOUNTER — Telehealth: Payer: Self-pay | Admitting: Student-PharmD

## 2020-12-18 NOTE — Telephone Encounter (Signed)
Today is the patient's fourth dose of Wegovy 1.7 mg weekly after switching from [place]. She reports experiencing more nausea since switching to [facility] but with each week's dose it has gotten better. She has lost 28 lbs as of yesterday. We discussed that we could continue for another 4 weeks on Wegovy 1.7 mg to let her nausea continue to improve or we can continue previous plan increase to max dose of Wegovy which is 2.4 mg weekly. Since she just took the fourth dose today she wants to wait and see how she does this week and then decide on Monday. She has my phone number and will reach out if she would like to remain on 1.7 mg for another month which would need another Rx. Otherwise she will increase to 2.4 mg which has already been sent in to her pharmacy.   Scheduled her for 3 month follow up with pharmacist clinic on 12/15. Will get updated weight and BP at this visit.

## 2020-12-24 ENCOUNTER — Other Ambulatory Visit (HOSPITAL_COMMUNITY): Payer: Self-pay

## 2020-12-27 ENCOUNTER — Telehealth: Payer: Self-pay | Admitting: Pharmacist

## 2020-12-27 NOTE — Telephone Encounter (Signed)
LV to reschedule PharmD apt

## 2021-01-07 NOTE — Progress Notes (Signed)
Patient ID: Nicole Anderson                 DOB: Sep 10, 1965                    MRN: [id number]     HPI: Nicole Anderson is a 55 y.o. female patient referred to pharmacy clinic by Dr. Harrell Gave to initiate weight loss therapy with GLP1-RA. PMH is significant for obesity complicated by chronic medical conditions including prediabetes, HLD, elevated calcium score 08/2020 (97th percentile for age and gender). Most recent BMI 40.84, previous BMI 45.74.  Today, patient reports doing well on Wegovy. She has taken the 2.4 mg dose for two weeks now. She had some nausea during the transition from [place] to [facility] 1.7 mg but it resolved after the second week at this dose and has not recurred. She has found that not eating late at night and avoiding rice helps to avoid any feelings of fullness/nausea overnight. She has lost 10.7% of her body weight (30 lbs) since starting GLP1RA treatment. No missed doses reported.   Current weight management medications: Wegovy 2.4 mg weekly (switched to [facility] after completing 6 weeks of Saxenda titration due to [facility] shortage)  Previously tried meds: none  Current meds that may affect weight: levonorgestrel (weight gain)  Baseline weight/BMI: 283.4 lb, BMI 45.74  Insurance payor: [employer] - covers weight loss medications  Diet: watching proportions, cut out sweets  Exercise: walks throughout the day, limited   Family History: Family history includes Atrial fibrillation in her maternal grandfather; Heart attack in her father.  Social History: Former smoker  Labs: Materials engineer Value Date   HGBA1C 6.1 (H) 09/10/2020   Wt Readings from Last 1 Encounters:  09/26/20 283 lb 6.4 oz (128.5 kg)   BP Readings from Last 1 Encounters:  09/26/20 122/78   Pulse Readings from Last 1 Encounters:  09/26/20 (!) 58      Component Value Date/Time   CHOL 208 (H) 09/10/2020 0742   TRIG 101 09/10/2020 0742   HDL 42 09/10/2020 0742   CHOLHDL 5.0  09/10/2020 0742   VLDL 20 09/10/2020 0742   LDLCALC 146 (H) 09/10/2020 0742   Past Medical History:  Diagnosis Date   Family history of adverse reaction to anesthesia    mom with complications   Current Outpatient Medications on File Prior to Visit  Medication Sig Dispense Refill   aspirin EC 81 MG tablet Take 1 tablet (81 mg total) by mouth daily. Swallow whole. 90 tablet 3   Insulin Pen Needle (PEN NEEDLES) 32G X 4 MM MISC Use to inject Saxenda into the abdomen once daily 100 each 1   levonorgestrel (MIRENA) 20 MCG/24HR IUD 1 each by Intrauterine route once.     Liraglutide -Weight Management (SAXENDA) 18 MG/3ML SOPN Inject 0.6 mg into the skin daily. After 7 days, increase to 1.2mg  x 7 days, then 1.8mg  x 7 days, 2.4mg  x 7 days, then 3mg . 15 mL 1   metoprolol tartrate (LOPRESSOR) 25 MG tablet Take 0.5 tablets (12.5 mg total) by mouth 2 (two) times daily. 90 tablet 1   nitroGLYCERIN (NITROSTAT) 0.4 MG SL tablet Place 1 tablet (0.4 mg total) under the tongue every 5 (five) minutes as needed for chest pain. 30 tablet 0   rosuvastatin (CRESTOR) 20 MG tablet Take 1 tablet (20 mg total) by mouth daily. 90 tablet 3   [START ON 02/07/2021] Semaglutide-Weight Management 1.7 MG/0.75ML SOAJ Inject 1.7  mg into the skin once a week. Start 11/2 after last dose of Saxenda on 11/1. 3 mL 0   [START ON 03/08/2021] Semaglutide-Weight Management 2.4 MG/0.75ML SOAJ Inject 2.4 mg into the skin once a week. 3 mL 11   No current facility-administered medications on file prior to visit.   Allergies  Allergen Reactions   Ketorolac Tromethamine Rash   Macrobid [Nitrofurantoin] Rash   Dilaudid [Hydromorphone] Itching    Assessment/Plan:  1. Weight loss - Patient has lost 10.7% of her body weight (30 lbs) since starting GLP1RA treatment 3 months ago. Due to [facility] shortage she was started on Saxenda then switched to available forms of Wegovy once the dose was slowly titrated up. Will continue Wegovy 2.4 mg  weekly, which is the max dose. Keep up the good work with healthy eating habits and physical activity.   6 month Wegovy follow up pharmacy visit is around the time of next visit with Dr. Harrell Gave in March so we will have her follow up with her then to check weight, BP, and A1c/lipid panel. These labs have already been entered and the patient will have them drawn at [facility] prior to her visit. Encouraged the patient to let us know if she needs anything or has questions for the pharmacy team in the meantime.    Rebbeca Paul, PharmD PGY2 Ambulatory Care Pharmacy Resident 01/08/2021 12:17 PM

## 2021-01-08 ENCOUNTER — Other Ambulatory Visit: Payer: Self-pay

## 2021-01-08 ENCOUNTER — Ambulatory Visit: Payer: 59 | Admitting: Student-PharmD

## 2021-01-08 DIAGNOSIS — Z6841 Body Mass Index (BMI) 40.0 and over, adult: Secondary | ICD-10-CM | POA: Diagnosis not present

## 2021-01-08 NOTE — Patient Instructions (Signed)
Nice meeting you today!  Continue taking Wegovy 2.4 mg weekly. You have a year's worth of refills on this prescription.   When you get labs for your appointment with Dr. Harrell Gave in March, please make sure you are getting a lipid panel and A1c (we've ordered these to [facility]).   Tips for living a healthier life     Building a Healthy and Balanced Diet Make most of your meal vegetables and fruits -  of your plate. Aim for color and variety, and remember that potatoes dont count as vegetables on the Healthy Eating Plate because of their negative impact on blood sugar.  Go for whole grains -  of your plate. Whole and intact grains--whole wheat, barley, wheat berries, quinoa, oats, brown rice, and foods made with them, such as whole wheat pasta--have a milder effect on blood sugar and insulin than white bread, white rice, and other refined grains.  Protein power -  of your plate. Fish, poultry, beans, and nuts are all healthy, versatile protein sources--they can be mixed into salads, and pair well with vegetables on a plate. Limit red meat, and avoid processed meats such as bacon and sausage.  Healthy plant oils - in moderation. Choose healthy vegetable oils like olive, canola, soy, corn, sunflower, peanut, and others, and avoid partially hydrogenated oils, which contain unhealthy trans fats. Remember that low-fat does not mean healthy.  Drink water, coffee, or tea. Skip sugary drinks, limit milk and dairy products to one to two servings per day, and limit juice to a small glass per day.  Stay active. The red figure running across the [place] is a reminder that staying active is also important in weight control.  The main message of the Healthy Eating Plate is to focus on diet quality:  The type of carbohydrate in the diet is more important than the amount of carbohydrate in the diet, because some sources of carbohydrate--like vegetables (other than  potatoes), fruits, whole grains, and beans--are healthier than others. The Healthy Eating Plate also advises consumers to avoid sugary beverages, a major source of calories--usually with little nutritional value--in the American diet. The Healthy Eating Plate encourages consumers to use healthy oils, and it does not set a maximum on the percentage of calories people should get each day from healthy sources of fat. In this way, the Healthy Eating Plate recommends the opposite of the low-fat message promoted for decades by the USDA.  DeskDistributor.no  SUGAR  Sugar is a huge problem in the modern day diet. Sugar is a big contributor to heart disease, diabetes, high triglyceride levels, fatty liver disease and obesity. Sugar is hidden in almost all packaged foods/beverages. Added sugar is extra sugar that is added beyond what is naturally found and has no nutritional benefit for your body. The American Heart Association recommends limiting added sugars to no more than 25g for women and 36 grams for men per day. There are many names for sugar including maltose, sucrose (names ending in "ose"), high fructose corn syrup, molasses, cane sugar, corn sweetener, raw sugar, syrup, honey or fruit juice concentrate.   One of the best ways to limit your added sugars is to stop drinking sweetened beverages such as soda, sweet tea, and fruit juice.  There is 65g of added sugars in one 20oz bottle of Coke! That is equal to 7.5 donuts.   Pay attention and read all nutrition facts labels. Below is an examples of a nutrition facts label. The #1  is showing you the total sugars where the # 2 is showing you the added sugars. This one serving has almost the max amount of added sugars per day!     20 oz Soda 65g Sugar = 7.5 Glazed Donuts  16oz Energy  Drink 54g Sugar = 6.5 Glazed Donuts  Large Sweet  Tea 38g Sugar = 4 Glazed Donuts  20oz Sports  Drink 34g Sugar  = 3.5 Glazed Donuts  8oz Chocolate Milk 24g Sugar =2.5 Glazed Donuts  8oz Orange  Juice 21g Sugar = 2 Glazed Donuts  1 Juice Box 14g Sugar = 1.5 Glazed Donuts  16oz Water= NO SUGAR!!  EXERCISE  Exercise is good. Weve all heard that. In an ideal world, we would all have time and resources to get plenty of it. When you are active, your heart pumps more efficiently and you will feel better.  Multiple studies show that even walking regularly has benefits that include living a longer life. The American Heart Association recommends 150 minutes per week of exercise (30 minutes per day most days of the week). You can do this in any increment you wish. Nine or more 10-minute walks count. So does an hour-long exercise class. Break the time apart into what will work in your life. Some of the best things you can do include walking briskly, jogging, cycling or swimming laps. Not everyone is ready to exercise. Sometimes we need to start with just getting active. Here are some easy ways to be more active throughout the day:  Take the stairs instead of the elevator  Go for a 10-15 minute walk during your lunch break (find a friend to make it more enjoyable)  When shopping, park at the back of the parking lot  If you take public transportation, get off one stop early and walk the extra distance  Pace around while making phone calls  Check with your doctor if you arent sure what your limitations may be. Always remember to drink plenty of water when doing any type of exercise. Dont feel like a failure if youre not getting the 90-150 minutes per week. If you started by being a couch potato, then just a 10-minute walk each day is a huge improvement. Start with little victories and work your way up.   HEALTHY EATING TIPS  When looking to improve your eating habits, whether to lose weight, lower blood pressure or just be healthier, it helps to know what a serving size is.   Grains 1 slice of bread,   bagel,  cup pasta or rice  Vegetables 1 cup fresh or raw vegetables,  cup cooked or canned Fruits 1 piece of medium sized fruit,  cup canned,   Meats/Proteins  cup dried       1 oz meat, 1 egg,  cup cooked beans, nuts or seeds  Dairy        Fats Individual yogurt container, 1 cup (8oz)    1 teaspoon margarine/butter or vegetable  milk or milk alternative, 1 slice of cheese          oil; 1 tablespoon mayonnaise or salad dressing                  Plan ahead: make a menu of the meals for a week then create a grocery list to go with that menu. Consider meals that easily stretch into a night of leftovers, such as stews or casseroles. Or consider making two of your favorite meal and put one  in the freezer for another night. Try a night or two each week that is meatless or no cook such as salads. When you get home from the grocery store wash and prepare your vegetables and fruits. Then when you need them they are ready to go.   Tips for going to the grocery store:  [employer] or generic brands  Check the weekly ad from your store on-line or in their in-store flyer  Look at the unit price on the shelf tag to compare/contrast the costs of different items  Buy fruits/vegetables in season  Carrots, bananas and apples are low-cost, naturally healthy items  If meats or frozen vegetables are on sale, buy some extras and put in your freezer  Limit buying prepared or ready to eat items, even if they are pre-made salads or fruit snacks  Do not shop when youre hungry  Foods at eye level tend to be more expensive. Look on the high and low shelves for deals.  Consider shopping at the farmers market for fresh foods in season.  Avoid the cookie and chip aisles (these are expensive, high in calories and low in nutritional value). Shop on the outside of the grocery store.  Healthy food preparations:  If you cant get lean hamburger, be sure to drain the fat when cooking  Steam, saut (in olive oil),  grill or bake foods  Experiment with different seasonings to avoid adding salt to your foods. Kosher salt, sea salt and Himalayan salt are all still salt and should be avoided. Try seasoning food with onion, garlic, thyme, rosemary, basil ect. Onion powder or garlic powder is ok. Avoid if it says salt (ie garlic salt).

## 2021-01-10 ENCOUNTER — Ambulatory Visit: Payer: 59

## 2021-01-15 ENCOUNTER — Other Ambulatory Visit (HOSPITAL_COMMUNITY): Payer: Self-pay

## 2021-01-22 DIAGNOSIS — Z1231 Encounter for screening mammogram for malignant neoplasm of breast: Secondary | ICD-10-CM | POA: Diagnosis not present

## 2021-01-22 LAB — HM MAMMOGRAPHY

## 2021-02-12 ENCOUNTER — Other Ambulatory Visit (HOSPITAL_COMMUNITY): Payer: Self-pay

## 2021-02-14 ENCOUNTER — Other Ambulatory Visit (HOSPITAL_COMMUNITY): Payer: Self-pay

## 2021-02-26 DIAGNOSIS — I2510 Atherosclerotic heart disease of native coronary artery without angina pectoris: Secondary | ICD-10-CM | POA: Diagnosis not present

## 2021-02-26 DIAGNOSIS — I480 Paroxysmal atrial fibrillation: Secondary | ICD-10-CM | POA: Diagnosis not present

## 2021-02-26 DIAGNOSIS — R7302 Impaired glucose tolerance (oral): Secondary | ICD-10-CM | POA: Diagnosis not present

## 2021-02-26 DIAGNOSIS — E7800 Pure hypercholesterolemia, unspecified: Secondary | ICD-10-CM | POA: Diagnosis not present

## 2021-02-26 LAB — HEMOGLOBIN A1C
Est. average glucose Bld gHb Est-mCnc: 120 mg/dL
Hgb A1c MFr Bld: 5.8 % — ABNORMAL HIGH (ref 4.8–5.6)

## 2021-02-27 LAB — LIPID PANEL
Chol/HDL Ratio: 2.6 ratio (ref 0.0–4.4)
Cholesterol, Total: 116 mg/dL (ref 100–199)
HDL: 45 mg/dL (ref >39)
LDL Chol Calc (NIH): 52 mg/dL (ref 0–99)
Triglycerides: 99 mg/dL (ref 0–149)
VLDL Cholesterol Cal: 19 mg/dL (ref 5–40)

## 2021-02-27 LAB — HEPATIC FUNCTION PANEL
ALT: 17 IU/L (ref 0–32)
AST: 13 IU/L (ref 0–40)
Albumin: 4.6 g/dL (ref 3.8–4.9)
Alkaline Phosphatase: 89 IU/L (ref 44–121)
Bilirubin Total: 0.4 mg/dL (ref 0.0–1.2)
Bilirubin, Direct: 0.14 mg/dL (ref 0.00–0.40)
Total Protein: 7.1 g/dL (ref 6.0–8.5)

## 2021-03-14 ENCOUNTER — Other Ambulatory Visit (HOSPITAL_COMMUNITY): Payer: Self-pay

## 2021-03-28 ENCOUNTER — Encounter (HOSPITAL_BASED_OUTPATIENT_CLINIC_OR_DEPARTMENT_OTHER): Payer: Self-pay

## 2021-04-01 ENCOUNTER — Other Ambulatory Visit (HOSPITAL_COMMUNITY): Payer: Self-pay

## 2021-04-01 ENCOUNTER — Ambulatory Visit (INDEPENDENT_AMBULATORY_CARE_PROVIDER_SITE_OTHER): Payer: 59 | Admitting: Cardiology

## 2021-04-01 ENCOUNTER — Telehealth (HOSPITAL_BASED_OUTPATIENT_CLINIC_OR_DEPARTMENT_OTHER): Payer: Self-pay

## 2021-04-01 ENCOUNTER — Other Ambulatory Visit: Payer: Self-pay

## 2021-04-01 ENCOUNTER — Encounter (HOSPITAL_BASED_OUTPATIENT_CLINIC_OR_DEPARTMENT_OTHER): Payer: Self-pay | Admitting: Cardiology

## 2021-04-01 VITALS — BP 120/96 | HR 66 | Ht 66.0 in | Wt 233.9 lb

## 2021-04-01 DIAGNOSIS — I480 Paroxysmal atrial fibrillation: Secondary | ICD-10-CM | POA: Diagnosis not present

## 2021-04-01 DIAGNOSIS — E7800 Pure hypercholesterolemia, unspecified: Secondary | ICD-10-CM | POA: Diagnosis not present

## 2021-04-01 DIAGNOSIS — R0683 Snoring: Secondary | ICD-10-CM

## 2021-04-01 DIAGNOSIS — I2510 Atherosclerotic heart disease of native coronary artery without angina pectoris: Secondary | ICD-10-CM

## 2021-04-01 DIAGNOSIS — E669 Obesity, unspecified: Secondary | ICD-10-CM

## 2021-04-01 NOTE — Patient Instructions (Signed)
Medication Instructions:  ?Your Physician recommend you continue on your current medication as directed.   ? ?*If you need a refill on your cardiac medications before your next appointment, please call your pharmacy* ? ? ?Lab Work: ?None ordered today ? ? ?Testing/Procedures: ?Your physician has recommended that you have a sleep study. This test records several body functions during sleep, including: brain activity, eye movement, oxygen and carbon dioxide blood levels, heart rate and rhythm, breathing rate and rhythm, the flow of air through your mouth and nose, snoring, body muscle movements, and chest and belly movement. ?[facility] ? ? ?Follow-Up: ?At [facility], you and your health needs are our priority.  As part of our continuing mission to provide you with exceptional heart care, we have created designated Provider Care Teams.  These Care Teams include your primary Cardiologist (physician) and Advanced Practice Providers (APPs -  Physician Assistants and Nurse Practitioners) who all work together to provide you with the care you need, when you need it. ? ?We recommend signing up for the patient portal called "MyChart".  Sign up information is provided on this After Visit Summary.  MyChart is used to connect with patients for Virtual Visits (Telemedicine).  Patients are able to view lab/test results, encounter notes, upcoming appointments, etc.  Non-urgent messages can be sent to your provider as well.   ?To learn more about what you can do with MyChart, go to [email or web address].   ? ?Your next appointment:   ?6 month(s) ? ?The format for your next appointment:   ?In Person ? ?Provider:   ?Buford Dresser, MD  ? ? ?

## 2021-04-01 NOTE — Progress Notes (Signed)
Cardiology Office Note:    Date:  04/01/2021   ID:  [name], DOB 01-16-1966, MRN [id number]  PCP:  Pcp, No  Cardiologist:  Buford Dresser, MD  CC: follow up   History of Present Illness:    Nicole Anderson is a 56 y.o. female with a hx of atrial fibrillation, chest discomfort who is seen for follow up. I met her in the hospital 09/10/20 when she presented with atrial fibrillation with RVR.  Cardiac history: Cardiac CTA 08/2020, which revealed a calcium score of 154, nonobstructive coronary disease in her proximal LAD. She had preeclampsia during both of her pregnancies.  Today: Overall, she appears well. However, she reports an isolated episode of chest pain in December and then "her heart went crazy" for about 10 minutes. Then last Thursday she believes she was in Atrial fibrillation with an hour duration. She woke up in the middle of the night due to thunder, and felt ill. She noted an irregular rhythm when she checked her pulse.   Recently she also felt a strange sensation like she was about to pass out. She sat down until her symptoms resolved. This has not recurred.  Since her last visit she has lost 50 lbs. She eliminated sodas from her diet, and continues to be conscientious of maintaining a healthy diet. No issues with edema since losing weight.  She has been told that she snores, and has caught herself snoring at times. She denies being told she has PND. She endorses some daytime somnolence only if she did not have sufficient sleep the night before.  She denies any shortness of breath. No lightheadedness, headaches, or orthopnea.   Past Medical History:  Diagnosis Date   Family history of adverse reaction to anesthesia    mom with complications    Past Surgical History:  Procedure Laterality Date   ACHILLES TENDON SURGERY Bilateral    APPENDECTOMY     CHOLECYSTECTOMY     REPAIR EXTENSOR TENDON WITH METATARSAL OSTEOTOMY AND OPEN REDUCTION IN Right 03/29/2020    Procedure: OPEN TREATMENT OF RIGHT FIFTH METATARSAL WITH REPAIR OF NONUNION, PERONEUS BREVIS TENOLYSIS;  Surgeon: Erle Crocker, MD;  Location: [facility];  Service: Orthopedics;  Laterality: Right;  LENGTH OF SURGERY: 1.5 HOURS   ROTATOR CUFF REPAIR      Current Medications: Current Outpatient Medications on File Prior to Visit  Medication Sig   aspirin EC 81 MG tablet Take 1 tablet (81 mg total) by mouth daily. Swallow whole.   levonorgestrel (MIRENA) 20 MCG/24HR IUD 1 each by Intrauterine route once.   metoprolol tartrate (LOPRESSOR) 25 MG tablet Take 0.5 tablets (12.5 mg total) by mouth 2 (two) times daily.   nitroGLYCERIN (NITROSTAT) 0.4 MG SL tablet Place 1 tablet (0.4 mg total) under the tongue every 5 (five) minutes as needed for chest pain.   rosuvastatin (CRESTOR) 20 MG tablet Take 1 tablet (20 mg total) by mouth daily.   Semaglutide-Weight Management 2.4 MG/0.75ML SOAJ Inject 2.4 mg into the skin once a week.   No current facility-administered medications on file prior to visit.     Allergies:   Ketorolac tromethamine, Macrobid [nitrofurantoin], and Dilaudid [hydromorphone]   Social History   Tobacco Use   Smoking status: Former   Smokeless tobacco: Never  Substance Use Topics   Alcohol use: Never   Drug use: Never    Family History: family history includes Atrial fibrillation in her maternal grandfather; Heart attack in her father. Her father  had a heart attack and hyperlipidemia. Her paternal grandmother had hyperlipidemia. Her grandfather had atrial fibrillation and a stroke.  ROS:   Please see the history of present illness. (+) Chest pain (Isolated episode) (+) Near-syncope (+) Snoring All other systems are reviewed and negative.    EKGs/Labs/Other Studies Reviewed:    The following studies were reviewed today:  Cardiac CTA 09/19/2020: FINDINGS: Non-cardiac: See separate report from [facility].   Pulmonary veins drain  normally to the left atrium. No LA appendage thrombus noted.   Calcium Score: 154 Agatston units.   Coronary Arteries: Right dominant with no anomalies   LM: No plaque or stenosis.   LAD system: Proximal mixed plaque with mild (<50%) stenosis.   Circumflex system: Small vessel, no plaque or stenosis.   RCA system: No plaque or stenosis.   IMPRESSION: 1. Coronary artery calcium score 154 Agatston units. This places the patient in the 97th percentile for age and gender, suggesting high risk for future cardiac events.   2.  Nonobstructive proximal LAD stenosis.  Echo 09/10/2020:  1. Left ventricular ejection fraction, by estimation, is 65 to 70%. The  left ventricle has normal function. The left ventricle has no regional  wall motion abnormalities. There is moderate concentric left ventricular  hypertrophy. Left ventricular  diastolic parameters are consistent with Grade I diastolic dysfunction  (impaired relaxation).   2. Right ventricular systolic function is normal. The right ventricular  size is normal. There is mildly elevated pulmonary artery systolic  pressure.   3. The mitral valve is normal in structure. No evidence of mitral valve  regurgitation. No evidence of mitral stenosis.   4. The aortic valve is tricuspid. Aortic valve regurgitation is not  visualized. No aortic stenosis is present.   5. The inferior vena cava is normal in size with greater than 50%  respiratory variability, suggesting right atrial pressure of 3 mmHg.   Comparison(s): No prior Echocardiogram.    EKG:  EKG is personally reviewed.   04/01/2021: NSR at 66 bpm, PRWP 09/26/2020: sinus bradycardia at 58 bpm, PRWP  Recent Labs: 09/10/2020: BUN 12; Creatinine, Ser 0.65; Hemoglobin 12.5; Magnesium 1.9; Platelets 342; Potassium 3.8; Sodium 139; TSH 3.116 02/26/2021: ALT 17   Recent Lipid Panel    Component Value Date/Time   CHOL 116 02/26/2021 1008   TRIG 99 02/26/2021 1008   HDL 45 02/26/2021  1008   CHOLHDL 2.6 02/26/2021 1008   CHOLHDL 5.0 09/10/2020 0742   VLDL 20 09/10/2020 0742   LDLCALC 52 02/26/2021 1008    Physical Exam:    VS:  BP (!) 120/96 (BP Location: Left Arm, Patient Position: Sitting, Cuff Size: Large)    Pulse 66    Ht 5\' 6"  (1.676 m)    Wt 233 lb 14.4 oz (106.1 kg)    BMI 37.75 kg/m     Wt Readings from Last 3 Encounters:  04/01/21 233 lb 14.4 oz (106.1 kg)  01/08/21 253 lb (114.8 kg)  09/26/20 283 lb 6.4 oz (128.5 kg)    GEN: Well nourished, well developed in no acute distress HEENT: Normal, moist mucous membranes NECK: No JVD CARDIAC: regular rhythm, normal S1 and S2, no rubs or gallops. No murmur. VASCULAR: Radial and DP pulses 2+ bilaterally. No carotid bruits RESPIRATORY:  Clear to auscultation without rales, wheezing or rhonchi  ABDOMEN: Soft, non-tender, non-distended MUSCULOSKELETAL:  Ambulates independently SKIN: Warm and dry, no edema NEUROLOGIC:  Alert and oriented x 3. No focal neuro deficits noted. PSYCHIATRIC:  Normal affect    ASSESSMENT:    1. Paroxysmal atrial fibrillation (HCC)   2. Snoring   3. Nonobstructive atherosclerosis of coronary artery   4. Hypercholesterolemia   5. Obesity (BMI 30-39.9)     PLAN:    Paroxysmal atrial fibrillation -had recent recurrence, duration of about an hour -chadsvasc=2 (female, nonobstructive CAD), not on anticoagulation as one of the points is for gender. Discussed that if this becomes more frequent or longer duration, I would re-evaluate for anticoagulation. Discussed that aspirin is not helpful for clots from afib -tolerating metoprolol well, she can take additional dose PRN for afib -discussed when to call office/present to ER.   Snoring: -stopbang=5 -as many of her afib events are at night, high concern for sleep apnea as possible etiology -will order home sleep study, she would benefit from CPAP if OSA noted.  Nonobstructive CAD Hypercholesterolemia Obesity, BMI 45->37 Impaired  glucose tolerance -continue aspirin and rosuvastatin -doing well on GLP1RA -reviewed red flag warning signs that need immediate medical attention  Cardiac risk counseling and prevention recommendations: -recommend heart healthy/Mediterranean diet, with whole grains, fruits, vegetable, fish, lean meats, nuts, and olive oil. Limit salt. -recommend moderate walking, 3-5 times/week for 30-50 minutes each session. Aim for at least 150 minutes.week. Goal should be pace of 3 miles/hours, or walking 1.5 miles in 30 minutes -recommend avoidance of tobacco products. Avoid excess alcohol.  Plan for follow up: 6 months or sooner as needed.  Buford Dresser, MD, PhD, [facility]    Medication Adjustments/Labs and Tests Ordered: Current medicines are reviewed at length with the patient today.  Concerns regarding medicines are outlined above.   Orders Placed This Encounter  Procedures   EKG 12-Lead   [facility] Sleep Study   No orders of the defined types were placed in this encounter.  Patient Instructions  Medication Instructions:  Your Physician recommend you continue on your current medication as directed.    *If you need a refill on your cardiac medications before your next appointment, please call your pharmacy*   Lab Work: None ordered today   Testing/Procedures: Your physician has recommended that you have a sleep study. This test records several body functions during sleep, including: brain activity, eye movement, oxygen and carbon dioxide blood levels, heart rate and rhythm, breathing rate and rhythm, the flow of air through your mouth and nose, snoring, body muscle movements, and chest and belly movement. Itamar   Follow-Up: At [facility], you and your health needs are our priority.  As part of our continuing mission to provide you with exceptional heart care, we have created designated Provider Care Teams.  These Care Teams include your primary  Cardiologist (physician) and Advanced Practice Providers (APPs -  Physician Assistants and Nurse Practitioners) who all work together to provide you with the care you need, when you need it.  We recommend signing up for the patient portal called "MyChart".  Sign up information is provided on this After Visit Summary.  MyChart is used to connect with patients for Virtual Visits (Telemedicine).  Patients are able to view lab/test results, encounter notes, upcoming appointments, etc.  Non-urgent messages can be sent to your provider as well.   To learn more about what you can do with MyChart, go to [email or web address].    Your next appointment:   6 month(s)  The format for your next appointment:   In Person  Provider:   Buford Dresser, MD  I,Mathew Stumpf,acting as a Education administrator for [employer], MD.,have documented all relevant documentation on the behalf of Buford Dresser, MD,as directed by  Buford Dresser, MD while in the presence of Buford Dresser, MD.  I, Buford Dresser, MD, have reviewed all documentation for this visit. The documentation on 04/01/21 for the exam, diagnosis, procedures, and orders are all accurate and complete.   Signed, Buford Dresser, MD PhD 04/01/2021 10:11 AM    [place]

## 2021-04-01 NOTE — Telephone Encounter (Signed)
Per sleep study coordinator, no PA require for Itamar and ok to activate.  ?CODE: [id number] ? ?Left message to call back.   ?

## 2021-04-02 ENCOUNTER — Encounter (HOSPITAL_BASED_OUTPATIENT_CLINIC_OR_DEPARTMENT_OTHER): Payer: Self-pay | Admitting: Family

## 2021-04-04 NOTE — Telephone Encounter (Signed)
Left message to call back  

## 2021-04-09 ENCOUNTER — Other Ambulatory Visit (HOSPITAL_COMMUNITY): Payer: Self-pay

## 2021-04-10 ENCOUNTER — Encounter (HOSPITAL_BASED_OUTPATIENT_CLINIC_OR_DEPARTMENT_OTHER): Payer: Self-pay

## 2021-04-10 NOTE — Telephone Encounter (Signed)
Would you mind taking a peak at this for me?  ?

## 2021-04-10 NOTE — Telephone Encounter (Signed)
Looks like Nicole Anderson called her and left a message to call back. Ok to activate. No PA is required.

## 2021-04-11 NOTE — Telephone Encounter (Signed)
Pt updated via mychart

## 2021-05-07 ENCOUNTER — Encounter (INDEPENDENT_AMBULATORY_CARE_PROVIDER_SITE_OTHER): Payer: 59 | Admitting: Cardiology

## 2021-05-07 ENCOUNTER — Ambulatory Visit: Payer: 59

## 2021-05-07 DIAGNOSIS — G4733 Obstructive sleep apnea (adult) (pediatric): Secondary | ICD-10-CM | POA: Diagnosis not present

## 2021-05-07 DIAGNOSIS — R0683 Snoring: Secondary | ICD-10-CM

## 2021-05-07 NOTE — Procedures (Signed)
? ?  SLEEP STUDY REPORT ?Patient Information ?Study Date: 05/02/21 ?Patient Name: Nicole Anderson ?Patient ID: Nicole Anderson ?Birth Date: 2065-06-16 ?Age: 56 ?Gender: Female ?BMI: 37.6 (W=233 lb, H=5' 6'') ?Referring Physician: Buford Dresser, MD ? ?TEST DESCRIPTION: Home sleep apnea testing was completed using the WatchPat, a Type 1 device, utilizing  ?peripheral arterial tonometry (PAT), chest movement, actigraphy, pulse oximetry, pulse rate, body position and snore.  ?AHI was calculated with apnea and hypopnea using valid sleep time as the denominator. RDI includes apneas,  ?hypopneas, and RERAs. The data acquired and the scoring of sleep and all associated events were performed in  ?accordance with the recommended standards and specifications as outlined in the AASM Manual for the Scoring of  ?Sleep and Associated Events 2.2.0 (2015).  ? ?FINDINGS:  ?1. Mild Obstructive Sleep Apnea with AHI 9.9/hr.  ?2. No Central Sleep Apnea with pAHIc 0.5/hr.  ?3. Oxygen desaturations as low as 82%.  ?4. Minimal snoring was present. O2 sats were < 88% for 2.7 min.  ?5. Total sleep time was 7 hrs and 42 min.  ?6. 35% of total sleep time was spent in REM sleep.  ?7. Normal sleep onset latency at 17 min.  ?8. Shortened REM sleep onset latency at 50 min.  ?9. Total awakenings were 3.  ? 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 ?RECOMMENDATIONS: ?1. Clinical correlation of these findings is necessary. The decision to treat obstructive sleep apnea (OSA) is usually  ?based on the presence of apnea symptoms or the presence of associated medical conditions such as Hypertension,  ?Congestive Heart Failure, Atrial Fibrillation or Obesity. The most common symptoms of OSA are snoring, gasping for  ?breath while sleeping, daytime sleepiness and fatigue.  ? ?2. Initiating apnea therapy is recommended given the presence of symptoms and/or associated conditions.  ?Recommend proceeding with one of the following: ? ? a. Auto-CPAP  therapy with a pressure range of 5-20cm H2O. ? ? b. An oral appliance (OA) that can be obtained from certain dentists with expertise in sleep medicine. These are  ?primarily of use in non-obese patients with mild and moderate disease. ? ? c. An ENT consultation which may be useful to look for specific causes of obstruction and possible treatment  ?options. ? ? d. If patient is intolerant to PAP therapy, consider referral to ENT for evaluation for hypoglossal nerve stimulator.  ? ?3. Close follow-up is necessary to ensure success with CPAP or oral appliance therapy for maximum benefit . ? ?4. A follow-up oximetry study on CPAP is recommended to assess the adequacy of therapy and determine the need  ?for supplemental oxygen or the potential need for Bi-level therapy. An arterial blood gas to determine the adequacy of  ?baseline ventilation and oxygenation should also be considered. ? ?5. Healthy sleep recommendations include: adequate nightly sleep (normal 7-9 hrs/night), avoidance of caffeine after  ?noon and alcohol near bedtime, and maintaining a sleep environment that is cool, dark and quiet. ? ?6. Weight loss for overweight patients is recommended. Even modest amounts of weight loss can significantly  ?improve the severity of sleep apnea. ? ?7. Snoring recommendations include: weight loss where appropriate, side sleeping, and avoidance of alcohol before  ?bed. ? ?8. Operation of motor vehicle should be avoided when sleepy. ? ?Signature: ?Electronically Signed: 05/07/21 ?Fransico Him, MD; [facility]; [place], [place] Board of  Sleep Medicine ? ?

## 2021-05-07 NOTE — Progress Notes (Deleted)
? ?  SLEEP STUDY REPORT ?Patient Information ?Study Date: 05/02/21 ?Patient Name: Nicole Anderson ?Patient ID: Nicole Anderson ?Birth Date: 2065-02-13 ?Age: 56 ?Gender: Female ?BMI: 37.6 (W=233 lb, H=5' 6'') ?Referring Physician: Buford Dresser, MD ? ?TEST DESCRIPTION: Home sleep apnea testing was completed using the WatchPat, a Type 1 device, utilizing  ?peripheral arterial tonometry (PAT), chest movement, actigraphy, pulse oximetry, pulse rate, body position and snore.  ?AHI was calculated with apnea and hypopnea using valid sleep time as the denominator. RDI includes apneas,  ?hypopneas, and RERAs. The data acquired and the scoring of sleep and all associated events were performed in  ?accordance with the recommended standards and specifications as outlined in the AASM Manual for the Scoring of  ?Sleep and Associated Events 2.2.0 (2015).  ? ?FINDINGS:  ?1. Mild Obstructive Sleep Apnea with AHI 9.9/hr.  ?2. No Central Sleep Apnea with pAHIc 0.5/hr.  ?3. Oxygen desaturations as low as 82%.  ?4. Minimal snoring was present. O2 sats were < 88% for 2.7 min.  ?5. Total sleep time was 7 hrs and 42 min.  ?6. 35% of total sleep time was spent in REM sleep.  ?7. Normal sleep onset latency at 17 min.  ?8. Shortened REM sleep onset latency at 50 min.  ?9. Total awakenings were 3.  ? 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 ?RECOMMENDATIONS: ?1. Clinical correlation of these findings is necessary. The decision to treat obstructive sleep apnea (OSA) is usually  ?based on the presence of apnea symptoms or the presence of associated medical conditions such as Hypertension,  ?Congestive Heart Failure, Atrial Fibrillation or Obesity. The most common symptoms of OSA are snoring, gasping for  ?breath while sleeping, daytime sleepiness and fatigue.  ? ?2. Initiating apnea therapy is recommended given the presence of symptoms and/or associated conditions.  ?Recommend proceeding with one of the following: ? ? a. Auto-CPAP  therapy with a pressure range of 5-20cm H2O. ? ? b. An oral appliance (OA) that can be obtained from certain dentists with expertise in sleep medicine. These are  ?primarily of use in non-obese patients with mild and moderate disease. ? ? c. An ENT consultation which may be useful to look for specific causes of obstruction and possible treatment  ?options. ? ? d. If patient is intolerant to PAP therapy, consider referral to ENT for evaluation for hypoglossal nerve stimulator.  ? ?3. Close follow-up is necessary to ensure success with CPAP or oral appliance therapy for maximum benefit . ? ?4. A follow-up oximetry study on CPAP is recommended to assess the adequacy of therapy and determine the need  ?for supplemental oxygen or the potential need for Bi-level therapy. An arterial blood gas to determine the adequacy of  ?baseline ventilation and oxygenation should also be considered. ? ?5. Healthy sleep recommendations include: adequate nightly sleep (normal 7-9 hrs/night), avoidance of caffeine after  ?noon and alcohol near bedtime, and maintaining a sleep environment that is cool, dark and quiet. ? ?6. Weight loss for overweight patients is recommended. Even modest amounts of weight loss can significantly  ?improve the severity of sleep apnea. ? ?7. Snoring recommendations include: weight loss where appropriate, side sleeping, and avoidance of alcohol before  ?bed. ? ?8. Operation of motor vehicle should be avoided when sleepy. ? ?Signature: ?Electronically Signed: 05/07/21 ?Fransico Him, MD; [facility]; [place], [place] Board of  Sleep Medicine ? ?

## 2021-05-09 ENCOUNTER — Telehealth: Payer: Self-pay | Admitting: *Deleted

## 2021-05-09 NOTE — Telephone Encounter (Signed)
-----   Message from Sueanne Margarita, MD sent at 05/07/2021  4:12 PM EDT ----- ?Please let patient know that they have sleep apnea and recommend treating with CPAP.  Please order an auto CPAP from 4-15cm H2O with heated humidity and mask of choice.  Order overnight pulse ox on CPAP.  Followup with me in 6 weeks.  ?  ?

## 2021-05-09 NOTE — Telephone Encounter (Signed)
Message left to return a call to discuss sleep study results and recommendations. 

## 2021-05-09 NOTE — Telephone Encounter (Signed)
Patient returned a call to me and was given HST results and recommendations. She agrees to proceed with CPAP treatment. Orders sent to [facility] via parach[facility] portal. ?

## 2021-05-13 ENCOUNTER — Other Ambulatory Visit (HOSPITAL_COMMUNITY): Payer: Self-pay

## 2021-05-20 ENCOUNTER — Other Ambulatory Visit (HOSPITAL_COMMUNITY): Payer: Self-pay

## 2021-06-04 ENCOUNTER — Encounter (HOSPITAL_BASED_OUTPATIENT_CLINIC_OR_DEPARTMENT_OTHER): Payer: Self-pay

## 2021-06-05 NOTE — Telephone Encounter (Signed)
Left message for patient to call back or reply to MyChart message in regards to sleep appointment with Dr. Radford Pax.  ?

## 2021-06-06 NOTE — Telephone Encounter (Signed)
Patient responding to you  ?

## 2021-06-07 ENCOUNTER — Other Ambulatory Visit (HOSPITAL_COMMUNITY): Payer: Self-pay

## 2021-06-07 ENCOUNTER — Encounter: Payer: 59 | Admitting: Cardiology

## 2021-06-07 VITALS — Ht 66.0 in | Wt 215.0 lb

## 2021-06-07 DIAGNOSIS — I480 Paroxysmal atrial fibrillation: Secondary | ICD-10-CM

## 2021-06-07 DIAGNOSIS — G4733 Obstructive sleep apnea (adult) (pediatric): Secondary | ICD-10-CM

## 2021-06-07 NOTE — Progress Notes (Deleted)
? ?Virtual Visit via Video Note  ? ?This visit type was conducted due to national recommendations for restrictions regarding the COVID-19 Pandemic (e.g. social distancing) in an effort to limit this patient's exposure and mitigate transmission in our community.  Due to her co-morbid illnesses, this patient is at least at moderate risk for complications without adequate follow up.  This format is felt to be most appropriate for this patient at this time.  All issues noted in this document were discussed and addressed.  A limited physical exam was performed with this format.  Please refer to the patient's chart for her consent to telehealth for [facility]. ? ?   ? ? ?Date:  06/07/2021  ? ?ID:  Nicole Anderson, DOB 10/16/1965, MRN [id number] ?The patient was identified using 2 identifiers. ? ?Patient Location: Home ?Provider Location: Home Office ? ? ?PCP:  Pcp, No ?  ?[facility] Providers ?Cardiologist:  Buford Dresser, MD    ? ?Evaluation Performed:  Follow-Up Visit ? ?Chief Complaint:  OSA ? ?History of Present Illness:   ? ?Nicole Anderson is a 56 y.o. 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 Harrell Gave after the patient complained of snoring and had an elevated STOP-BANG score at 5.  She apparently was having a lot of her A-fib events at night.  She underwent home sleep study showing mild obstructive sleep apnea with an AHI of 9.9/h and O2 saturations as low as 82%.  She is now here to discuss her results. ? ? ?Past Medical History:  ?Diagnosis Date  ? Family history of adverse reaction to anesthesia   ? mom with complications  ? ?Past Surgical History:  ?Procedure Laterality Date  ? ACHILLES TENDON SURGERY Bilateral   ? APPENDECTOMY    ? CHOLECYSTECTOMY    ? REPAIR EXTENSOR TENDON WITH METATARSAL OSTEOTOMY AND OPEN REDUCTION IN Right 03/29/2020  ? Procedure: OPEN TREATMENT OF RIGHT FIFTH METATARSAL WITH REPAIR OF NONUNION,  PERONEUS BREVIS TENOLYSIS;  Surgeon: Erle Crocker, MD;  Location: [place];  Service: Orthopedics;  Laterality: Right;  LENGTH OF SURGERY: 1.5 HOURS  ? ROTATOR CUFF REPAIR    ?  ? ?No outpatient medications have been marked as taking for the 06/07/21 encounter (Appointment) with Sueanne Margarita, MD.  ?  ? ?Allergies:   Ketorolac tromethamine, Macrobid [nitrofurantoin], and Dilaudid [hydromorphone]  ? ?Social History  ? ?Tobacco Use  ? Smoking status: Former  ? Smokeless tobacco: Never  ?Substance Use Topics  ? Alcohol use: Never  ? Drug use: Never  ?  ? ?Family Hx: ?The patient's family history includes Atrial fibrillation in her maternal grandfather; Heart attack in her father. ? ?ROS:   ?Please see the history of present illness.    ? ?All other systems reviewed and are negative. ? ? ?Prior CV studies:   ?The following studies were reviewed today: ? ?Home sleep study ? ?Labs/Other Tests and Data Reviewed:   ? ?EKG:  {EKG/Telemetry Strips Reviewed:[id number]} ? ?Recent Labs: ?09/10/2020: BUN 12; Creatinine, Ser 0.65; Hemoglobin 12.5; Magnesium 1.9; Platelets 342; Potassium 3.8; Sodium 139; TSH 3.116 ?02/26/2021: ALT 17  ? ?Recent Lipid Panel ?Lab Results  ?Component Value Date/Time  ? CHOL 116 02/26/2021 10:08 AM  ? TRIG 99 02/26/2021 10:08 AM  ? HDL 45 02/26/2021 10:08 AM  ? CHOLHDL 2.6 02/26/2021 10:08 AM  ? CHOLHDL 5.0 09/10/2020 07:42 AM  ?  [place] 52 02/26/2021 10:08 AM  ? ? ?Wt Readings from Last 3 Encounters:  ?04/01/21 233 lb 14.4 oz (106.1 kg)  ?01/08/21 253 lb (114.8 kg)  ?09/26/20 283 lb 6.4 oz (128.5 kg)  ?  ? ?Risk Assessment/Calculations:   ? ?CHA2DS2-VASc Score = 1  ?This indicates a 0.6% annual risk of stroke. ?The patient's score is based upon: ?CHF History: 0 ?HTN History: 0 ?Diabetes History: 0 ?Stroke History: 0 ?Vascular Disease History: 0 ?Age Score: 0 ?Gender Score: 1 ?  ?  ? ?STOP-Bang Score:  5  { ?Consider Dx Sleep Disordered Breathing or Sleep Apnea  ICD G47.33           :1}    ?Objective:   ? ?Vital Signs:  There were no vitals taken for this visit.  ? ?VITAL SIGNS:  reviewed ?GEN:  no acute distress ?EYES:  sclerae anicteric, EOMI - Extraocular Movements Intact ?RESPIRATORY:  normal respiratory effort, symmetric expansion ?CARDIOVASCULAR:  no peripheral edema ?SKIN:  no rash, lesions or ulcers. ?MUSCULOSKELETAL:  no obvious deformities. ?NEURO:  alert and oriented x 3, no obvious focal deficit ?PSYCH:  normal affect ? ?ASSESSMENT & PLAN:   ? ?OSA ?-She has symptoms of snoring as well as nocturnal arrhythmias including PAF ?-Sleep study showed mild obstructive sleep apnea with an AHI of 9.9/h ?-Due to her nocturnal arrhythmias, snoring and mild obstructive sleep apnea I recommended proceeding with CPAP therapy ?-I will order her a new ResMed auto CPAP from 4 to 15 cm H2O with heated humidity and mask of choice ?-Follow-up with me in 6 weeks ? ?2.  Paroxysmal atrial fibrillation ?-She has had problems with nocturnal arrhythmias ?-Continue prescription drug therapy with Lopressor 12.5 mg twice daily with as needed refills ? ?  ?Time:   ?Today, I have spent 15 minutes with the patient with telehealth technology discussing the above problems.   ? ? ?Medication Adjustments/Labs and Tests Ordered: ?Current medicines are reviewed at length with the patient today.  Concerns regarding medicines are outlined above.  ? ?Tests Ordered: ?No orders of the defined types were placed in this encounter. ? ? ?Medication Changes: ?No orders of the defined types were placed in this encounter. ? ? ?Follow Up:  In Person in 6 week(s) ? ?Signed, ?Fransico Him, MD  ?06/07/2021 8:27 AM    ?[place] ?

## 2021-06-07 NOTE — Telephone Encounter (Signed)
Patient responding to you  ?

## 2021-06-08 NOTE — Progress Notes (Signed)
This encounter was created in error - please disregard.

## 2021-06-20 ENCOUNTER — Ambulatory Visit: Payer: 59 | Admitting: Cardiology

## 2021-06-20 ENCOUNTER — Telehealth: Payer: Self-pay | Admitting: *Deleted

## 2021-06-20 ENCOUNTER — Encounter: Payer: Self-pay | Admitting: Cardiology

## 2021-06-20 VITALS — BP 120/66 | HR 62 | Ht 66.0 in | Wt 218.0 lb

## 2021-06-20 DIAGNOSIS — G4733 Obstructive sleep apnea (adult) (pediatric): Secondary | ICD-10-CM

## 2021-06-20 NOTE — Addendum Note (Signed)
Addended by: Freada Bergeron on: 06/20/2021 05:56 PM   Modules accepted: Orders

## 2021-06-20 NOTE — Telephone Encounter (Signed)
Order placed by Mariann Laster to [facility] already.

## 2021-06-20 NOTE — Telephone Encounter (Signed)
-----   Message from [place], [place] sent at 06/20/2021  3:35 PM EDT ----- Per Dr. Radford Pax:  Please order auto CPAP from 4 to 15cm H2O with heated humidity and Airfit nasal pillow mask with chin strap. Follow up 6 weeks after receiving device.  Thanks,  [employer]

## 2021-06-20 NOTE — Progress Notes (Addendum)
Sleep Medicine Consult Note:    Date:  06/20/2021   ID:  Nicole Anderson, DOB 1965/07/22, MRN [id number]  PCP:  Pcp, No  Cardiologist:  Buford Dresser, MD    Referring MD: Buford Dresser, MD  Chief Complaint  Patient presents with   Sleep Apnea    History of Present Illness:    Nicole Anderson is a 56 y.o. female with a hx of obesity who was referred for evaluation of OSA by Buford Dresser, MD.  The patient underwent home sleep study due to paroxysmal atrial fibrillation, obesity and excessive daytime sleepiness.  She recently told Dr. Harrell Gave that she would feel tired during the day but only if she did not get sufficient sleep the night before.  She also has been told that she snores and sometimes caught her self snoring which would awaken her.  She underwent home sleep study showing moderate obstructive sleep apnea with an AHI of 9.9/h and no central events.  There was no evidence of nocturnal hypoxemia.  She is now here to discuss her results.  She tells me that the episodes of PAF she has had have been in the middle of the night.    Past Medical History:  Diagnosis Date   Family history of adverse reaction to anesthesia    mom with complications    Past Surgical History:  Procedure Laterality Date   ACHILLES TENDON SURGERY Bilateral    APPENDECTOMY     CHOLECYSTECTOMY     REPAIR EXTENSOR TENDON WITH METATARSAL OSTEOTOMY AND OPEN REDUCTION IN Right 03/29/2020   Procedure: OPEN TREATMENT OF RIGHT FIFTH METATARSAL WITH REPAIR OF NONUNION, PERONEUS BREVIS TENOLYSIS;  Surgeon: Erle Crocker, MD;  Location: [place];  Service: Orthopedics;  Laterality: Right;  LENGTH OF SURGERY: 1.5 HOURS   ROTATOR CUFF REPAIR      Current Medications: Current Meds  Medication Sig   aspirin EC 81 MG tablet Take 1 tablet (81 mg total) by mouth daily. Swallow whole.   levonorgestrel (MIRENA) 20 MCG/24HR IUD 1 each by Intrauterine route once.   metoprolol  tartrate (LOPRESSOR) 25 MG tablet Take 0.5 tablets (12.5 mg total) by mouth 2 (two) times daily.   nitroGLYCERIN (NITROSTAT) 0.4 MG SL tablet Place 1 tablet (0.4 mg total) under the tongue every 5 (five) minutes as needed for chest pain.   rosuvastatin (CRESTOR) 20 MG tablet Take 1 tablet (20 mg total) by mouth daily.   Semaglutide-Weight Management 2.4 MG/0.75ML SOAJ Inject 2.4 mg into the skin once a week.     Allergies:   Ketorolac tromethamine, Macrobid [nitrofurantoin], and Dilaudid [hydromorphone]   Social History   Socioeconomic History   Marital status: Married    Spouse name: Not on file   Number of children: Not on file   Years of education: Not on file   Highest education level: Not on file  Occupational History   Not on file  Tobacco Use   Smoking status: Former   Smokeless tobacco: Never  Substance and Sexual Activity   Alcohol use: Never   Drug use: Never   Sexual activity: Not on file  Other Topics Concern   Not on file  Social History Narrative   Not on file   Social Determinants of Health   Financial Resource Strain: Not on file  Food Insecurity: Not on file  Transportation Needs: Not on file  Physical Activity: Not on file  Stress: Not on file  Social Connections: Not on file  Family History: The patient's family history includes Atrial fibrillation in her maternal grandfather; Heart attack in her father.  ROS:   Please see the history of present illness.    ROS  All other systems reviewed and negative.   EKGs/Labs/Other Studies Reviewed:    The following studies were reviewed today: Home sleep study  EKG:  EKG is not ordered today.    Recent Labs: 09/10/2020: BUN 12; Creatinine, Ser 0.65; Hemoglobin 12.5; Magnesium 1.9; Platelets 342; Potassium 3.8; Sodium 139; TSH 3.116 02/26/2021: ALT 17   Recent Lipid Panel    Component Value Date/Time   CHOL 116 02/26/2021 1008   TRIG 99 02/26/2021 1008   HDL 45 02/26/2021 1008   CHOLHDL 2.6  02/26/2021 1008   CHOLHDL 5.0 09/10/2020 0742   VLDL 20 09/10/2020 0742   LDLCALC 52 02/26/2021 1008    CHA2DS2-VASc Score = 1 [CHF History: 0, HTN History: 0, Diabetes History: 0, Stroke History: 0, Vascular Disease History: 0, Age Score: 0, Gender Score: 1].  Therefore, the patient's annual risk of stroke is 0.6 %.        Physical Exam:    VS:  BP 120/66   Pulse 62   Ht '5\' 6"'$  (1.676 m)   Wt 218 lb (98.9 kg)   SpO2 97%   BMI 35.19 kg/m     Wt Readings from Last 3 Encounters:  06/20/21 218 lb (98.9 kg)  06/07/21 215 lb (97.5 kg)  04/01/21 233 lb 14.4 oz (106.1 kg)     GEN:  Well nourished, well developed in no acute distress HEENT: Normal NECK: No JVD; No carotid bruits LYMPHATICS: No lymphadenopathy CARDIAC: RRR, no murmurs, rubs, gallops RESPIRATORY:  Clear to auscultation without rales, wheezing or rhonchi  ABDOMEN: Soft, non-tender, non-distended MUSCULOSKELETAL:  No edema; No deformity  SKIN: Warm and dry NEUROLOGIC:  Alert and oriented x 3 PSYCHIATRIC:  Normal affect   ASSESSMENT:    1. OSA (obstructive sleep apnea)    PLAN:    In order of problems listed above:  1.  Obstructive sleep apnea -This was done due to complaints of excessive daytime sleepiness and snoring -She has mild obstructive sleep apnea by home sleep study with an AHI of 9.9/h and no nocturnal hypoxemia -We discussed options for treatment including aggressive weight loss, practicing good sleep hygiene and avoiding sleeping supine versus referral to dentistry for oral device versus a trial of CPAP therapy -given that her PAF has occurred only at night I suspect that her OSA is triggering PAF. -I have recommended starting on auto CPAP from 4 to 15cm H2O with heated humidity and mask of choice -I will see her back in 6 weeks after getting her CPAP to see how she is doing   Medication Adjustments/Labs and Tests Ordered: Current medicines are reviewed at length with the patient today.   Concerns regarding medicines are outlined above.  No orders of the defined types were placed in this encounter.  No orders of the defined types were placed in this encounter.   Signed, Fransico Him, MD [facility], [place] Board of Sleep Medicine 06/20/2021 3:19 PM    [facility]/Sleep Medicine

## 2021-06-20 NOTE — Patient Instructions (Signed)
Medication Instructions:  Your physician recommends that you continue on your current medications as directed. Please refer to the Current Medication list given to you today.  *If you need a refill on your cardiac medications before your next appointment, please call your pharmacy*  Follow-Up: At [facility], you and your health needs are our priority.  As part of our continuing mission to provide you with exceptional heart care, we have created designated Provider Care Teams.  These Care Teams include your primary Cardiologist (physician) and Advanced Practice Providers (APPs -  Physician Assistants and Nurse Practitioners) who all work together to provide you with the care you need, when you need it.  Your next appointment:   6 week(s) after receiving your device  The format for your next appointment:   In Person or Virtual  Provider:   Fransico Him, MD     Important Information About Sugar

## 2021-06-21 NOTE — Addendum Note (Signed)
Addended by: Fransico Him R on: 06/21/2021 05:01 AM   Modules accepted: Level of Service

## 2021-07-08 ENCOUNTER — Other Ambulatory Visit (HOSPITAL_COMMUNITY): Payer: Self-pay

## 2021-07-08 ENCOUNTER — Other Ambulatory Visit: Payer: Self-pay | Admitting: Cardiology

## 2021-07-08 DIAGNOSIS — T07XXXA Unspecified multiple injuries, initial encounter: Secondary | ICD-10-CM | POA: Diagnosis not present

## 2021-07-09 ENCOUNTER — Other Ambulatory Visit (HOSPITAL_COMMUNITY): Payer: Self-pay

## 2021-07-09 MED ORDER — METOPROLOL TARTRATE 25 MG PO TABS
12.5000 mg | ORAL_TABLET | Freq: Two times a day (BID) | ORAL | 1 refills | Status: DC
  Filled 2021-07-09: qty 90, 90d supply, fill #0
  Filled 2021-10-04: qty 90, 90d supply, fill #1

## 2021-07-09 NOTE — Telephone Encounter (Signed)
Rx request sent to pharmacy.  

## 2021-07-29 ENCOUNTER — Other Ambulatory Visit (HOSPITAL_COMMUNITY): Payer: Self-pay

## 2021-07-31 ENCOUNTER — Other Ambulatory Visit (HOSPITAL_COMMUNITY): Payer: Self-pay

## 2021-08-21 DIAGNOSIS — G4733 Obstructive sleep apnea (adult) (pediatric): Secondary | ICD-10-CM | POA: Diagnosis not present

## 2021-08-28 ENCOUNTER — Other Ambulatory Visit (HOSPITAL_COMMUNITY): Payer: Self-pay

## 2021-09-02 ENCOUNTER — Other Ambulatory Visit (HOSPITAL_COMMUNITY): Payer: Self-pay

## 2021-09-06 ENCOUNTER — Other Ambulatory Visit (HOSPITAL_COMMUNITY): Payer: Self-pay

## 2021-09-18 NOTE — Progress Notes (Unsigned)
   New Patient Visit  There were no vitals taken for this visit.   Subjective:    Patient ID: Nicole Anderson, female    DOB: Feb 25, 1965, 56 y.o.   MRN: [id number]  CC: No chief complaint on file.   HPI: Nicole Anderson is a 56 y.o. female presents for new patient visit to establish care.  Introduced to Designer, jewellery role and practice setting.  All questions answered.  Discussed provider/patient relationship and expectations.   Past Medical History:  Diagnosis Date   Family history of adverse reaction to anesthesia    mom with complications    Past Surgical History:  Procedure Laterality Date   ACHILLES TENDON SURGERY Bilateral    APPENDECTOMY     CHOLECYSTECTOMY     REPAIR EXTENSOR TENDON WITH METATARSAL OSTEOTOMY AND OPEN REDUCTION IN Right 03/29/2020   Procedure: OPEN TREATMENT OF RIGHT FIFTH METATARSAL WITH REPAIR OF NONUNION, PERONEUS BREVIS TENOLYSIS;  Surgeon: Erle Crocker, MD;  Location: [place];  Service: Orthopedics;  Laterality: Right;  LENGTH OF SURGERY: 1.5 HOURS   ROTATOR CUFF REPAIR      Family History  Problem Relation Age of Onset   Heart attack Father    Atrial fibrillation Maternal Grandfather      Social History   Tobacco Use   Smoking status: Former   Smokeless tobacco: Never  Substance Use Topics   Alcohol use: Never   Drug use: Never    Current Outpatient Medications on File Prior to Visit  Medication Sig Dispense Refill   aspirin EC 81 MG tablet Take 1 tablet (81 mg total) by mouth daily. Swallow whole. 90 tablet 3   levonorgestrel (MIRENA) 20 MCG/24HR IUD 1 each by Intrauterine route once.     metoprolol tartrate (LOPRESSOR) 25 MG tablet Take 0.5 tablets (12.5 mg total) by mouth 2 (two) times daily. 90 tablet 1   nitroGLYCERIN (NITROSTAT) 0.4 MG SL tablet Place 1 tablet (0.4 mg total) under the tongue every 5 (five) minutes as needed for chest pain. 30 tablet 0   rosuvastatin (CRESTOR) 20 MG tablet Take 1 tablet  (20 mg total) by mouth daily. 90 tablet 3   Semaglutide-Weight Management 2.4 MG/0.75ML SOAJ Inject 2.4 mg into the skin once a week. 3 mL 11   No current facility-administered medications on file prior to visit.     Review of Systems      Objective:    There were no vitals taken for this visit.  Wt Readings from Last 3 Encounters:  06/20/21 218 lb (98.9 kg)  06/07/21 215 lb (97.5 kg)  04/01/21 233 lb 14.4 oz (106.1 kg)    BP Readings from Last 3 Encounters:  06/20/21 120/66  04/01/21 (!) 120/96  01/08/21 128/86    Physical Exam     Assessment & Plan:   Problem List Items Addressed This Visit   None    Follow up plan: No follow-ups on file.

## 2021-09-19 ENCOUNTER — Other Ambulatory Visit (HOSPITAL_COMMUNITY): Payer: Self-pay

## 2021-09-19 ENCOUNTER — Encounter: Payer: Self-pay | Admitting: Nurse Practitioner

## 2021-09-19 ENCOUNTER — Ambulatory Visit: Payer: 59 | Admitting: Nurse Practitioner

## 2021-09-19 VITALS — BP 129/70 | Temp 97.6°F | Ht 66.0 in | Wt 209.6 lb

## 2021-09-19 DIAGNOSIS — R7303 Prediabetes: Secondary | ICD-10-CM | POA: Insufficient documentation

## 2021-09-19 DIAGNOSIS — E669 Obesity, unspecified: Secondary | ICD-10-CM | POA: Diagnosis not present

## 2021-09-19 DIAGNOSIS — E785 Hyperlipidemia, unspecified: Secondary | ICD-10-CM | POA: Diagnosis not present

## 2021-09-19 DIAGNOSIS — Z1211 Encounter for screening for malignant neoplasm of colon: Secondary | ICD-10-CM

## 2021-09-19 DIAGNOSIS — Z975 Presence of (intrauterine) contraceptive device: Secondary | ICD-10-CM | POA: Diagnosis not present

## 2021-09-19 DIAGNOSIS — E041 Nontoxic single thyroid nodule: Secondary | ICD-10-CM | POA: Diagnosis not present

## 2021-09-19 DIAGNOSIS — Z0000 Encounter for general adult medical examination without abnormal findings: Secondary | ICD-10-CM

## 2021-09-19 DIAGNOSIS — G4733 Obstructive sleep apnea (adult) (pediatric): Secondary | ICD-10-CM | POA: Diagnosis not present

## 2021-09-19 DIAGNOSIS — G4730 Sleep apnea, unspecified: Secondary | ICD-10-CM | POA: Insufficient documentation

## 2021-09-19 DIAGNOSIS — I480 Paroxysmal atrial fibrillation: Secondary | ICD-10-CM

## 2021-09-19 DIAGNOSIS — E7800 Pure hypercholesterolemia, unspecified: Secondary | ICD-10-CM | POA: Insufficient documentation

## 2021-09-19 LAB — COMPREHENSIVE METABOLIC PANEL
ALT: 16 U/L (ref 0–35)
AST: 15 U/L (ref 0–37)
Albumin: 4.3 g/dL (ref 3.5–5.2)
Alkaline Phosphatase: 62 U/L (ref 39–117)
BUN: 20 mg/dL (ref 6–23)
CO2: 30 mEq/L (ref 19–32)
Calcium: 9.7 mg/dL (ref 8.4–10.5)
Chloride: 102 mEq/L (ref 96–112)
Creatinine, Ser: 0.66 mg/dL (ref 0.40–1.20)
GFR: 97.86 mL/min (ref >60.00)
Glucose, Bld: 87 mg/dL (ref 70–99)
Potassium: 4 mEq/L (ref 3.5–5.1)
Sodium: 140 mEq/L (ref 135–145)
Total Bilirubin: 0.6 mg/dL (ref 0.2–1.2)
Total Protein: 7.2 g/dL (ref 6.0–8.3)

## 2021-09-19 LAB — LIPID PANEL
Cholesterol: 110 mg/dL (ref 0–200)
HDL: 47.1 mg/dL (ref >39.00)
LDL Cholesterol: 50 mg/dL (ref 0–99)
NonHDL: 63.16
Total CHOL/HDL Ratio: 2
Triglycerides: 67 mg/dL (ref 0.0–149.0)
VLDL: 13.4 mg/dL (ref 0.0–40.0)

## 2021-09-19 LAB — CBC
HCT: 37.8 % (ref 36.0–46.0)
Hemoglobin: 12.8 g/dL (ref 12.0–15.0)
MCHC: 33.8 g/dL (ref 30.0–36.0)
MCV: 91.2 fl (ref 78.0–100.0)
Platelets: 294 10*3/uL (ref 150.0–400.0)
RBC: 4.14 Mil/uL (ref 3.87–5.11)
RDW: 13.6 % (ref 11.5–15.5)
WBC: 8.5 10*3/uL (ref 4.0–10.5)

## 2021-09-19 LAB — TSH: TSH: 0.83 u[IU]/mL (ref 0.35–5.50)

## 2021-09-19 LAB — HEMOGLOBIN A1C: Hgb A1c MFr Bld: 5.6 % (ref 4.6–6.5)

## 2021-09-19 MED ORDER — ROSUVASTATIN CALCIUM 20 MG PO TABS
20.0000 mg | ORAL_TABLET | Freq: Every day | ORAL | 3 refills | Status: DC
  Filled 2021-09-19 – 2021-11-06 (×2): qty 90, 90d supply, fill #0
  Filled 2022-02-04: qty 90, 90d supply, fill #1
  Filled 2022-05-11: qty 90, 90d supply, fill #2
  Filled 2022-08-14: qty 90, 90d supply, fill #3

## 2021-09-19 NOTE — Patient Instructions (Signed)
It was great to see you!  We are checking your labs today and will let you know the results via mychart/phone.   I have refilled your cholesterol medicine.  I have place a referral to GYN and GI.   Let's follow-up in 6 months, sooner if you have concerns.  If a referral was placed today, you will be contacted for an appointment. Please note that routine referrals can sometimes take up to 3-4 weeks to process. Please call our office if you haven't heard anything after this time frame.  Take care,  Vance Peper, NP

## 2021-09-19 NOTE — Assessment & Plan Note (Signed)
She has lost 75 pounds over the last year, congratulated her on this!  We will have her continue Wegovy 2.4 mg injection weekly.  Encouraged her to start doing light exercises, walking for 5 minutes at a time when able.

## 2021-09-19 NOTE — Assessment & Plan Note (Signed)
She has a history of paroxysmal A-fib.  This is well controlled and she follows with cardiology.  Continue aspirin 81 mg daily and metoprolol 12.5 mg twice a day.  Continue collaboration recommendations from cardiology.

## 2021-09-19 NOTE — Assessment & Plan Note (Signed)
Did a biopsy of this and it came back benign.  We will check TSH today

## 2021-09-19 NOTE — Assessment & Plan Note (Signed)
History of elevated cholesterol that is well controlled on rosuvastatin 20 mg daily.  We will check lipid panel today.

## 2021-09-19 NOTE — Assessment & Plan Note (Signed)
Her A1c has improved since starting [facility] and losing 75 pounds.  We will check A1c today.

## 2021-09-19 NOTE — Assessment & Plan Note (Signed)
Chronic, stable.  Continue CPAP at night. 

## 2021-09-21 DIAGNOSIS — G4733 Obstructive sleep apnea (adult) (pediatric): Secondary | ICD-10-CM | POA: Diagnosis not present

## 2021-09-25 ENCOUNTER — Encounter: Payer: Self-pay | Admitting: General Practice

## 2021-10-07 ENCOUNTER — Other Ambulatory Visit (HOSPITAL_COMMUNITY): Payer: Self-pay

## 2021-10-10 ENCOUNTER — Ambulatory Visit (HOSPITAL_BASED_OUTPATIENT_CLINIC_OR_DEPARTMENT_OTHER): Payer: 59 | Admitting: Cardiology

## 2021-10-10 ENCOUNTER — Encounter (HOSPITAL_BASED_OUTPATIENT_CLINIC_OR_DEPARTMENT_OTHER): Payer: Self-pay | Admitting: Cardiology

## 2021-10-10 ENCOUNTER — Other Ambulatory Visit (HOSPITAL_COMMUNITY): Payer: Self-pay

## 2021-10-10 VITALS — BP 132/80 | HR 57 | Ht 66.0 in | Wt 209.3 lb

## 2021-10-10 DIAGNOSIS — E7800 Pure hypercholesterolemia, unspecified: Secondary | ICD-10-CM | POA: Diagnosis not present

## 2021-10-10 DIAGNOSIS — I480 Paroxysmal atrial fibrillation: Secondary | ICD-10-CM

## 2021-10-10 DIAGNOSIS — G4733 Obstructive sleep apnea (adult) (pediatric): Secondary | ICD-10-CM | POA: Diagnosis not present

## 2021-10-10 DIAGNOSIS — I2510 Atherosclerotic heart disease of native coronary artery without angina pectoris: Secondary | ICD-10-CM

## 2021-10-10 MED ORDER — SEMAGLUTIDE-WEIGHT MANAGEMENT 2.4 MG/0.75ML ~~LOC~~ SOAJ
2.4000 mg | SUBCUTANEOUS | 11 refills | Status: DC
  Filled 2021-10-10 – 2021-10-23 (×2): qty 3, 28d supply, fill #0
  Filled 2021-11-19: qty 3, 28d supply, fill #1
  Filled 2021-12-15: qty 3, 28d supply, fill #2
  Filled 2022-01-06: qty 3, 28d supply, fill #3
  Filled 2022-02-04: qty 3, 28d supply, fill #4

## 2021-10-10 NOTE — Progress Notes (Signed)
Cardiology Office Note:    Date:  10/10/2021   ID:  Nicole Anderson, DOB 07-15-1965, MRN [id number]  PCP:  Charyl Dancer, NP  Cardiologist:  Buford Dresser, MD  CC: follow up   History of Present Illness:    Nicole Anderson is a 56 y.o. female with a hx of atrial fibrillation, chest discomfort who is seen for follow up. I met her in the hospital 09/10/20 when she presented with atrial fibrillation with RVR.  Cardiac history: Cardiac CTA 08/2020, which revealed a calcium score of 154, nonobstructive coronary disease in her proximal LAD. She had preeclampsia during both of her pregnancies.  At her last visit on 04/01/2021 she appeared well but reported an isolated episode of chest pain in December and then "her heart went crazy" for about 10 minutes. She also had an episode of atrial fibrillation that lasted an hour a few days before her appointment. She woke up in the middle of the night due to thunder, and felt ill. She noted an irregular rhythm when she checked her pulse. She also had an isolated episode where she felt a strange sensation like she was about to pass out. She sat down until her symptoms resolved. Prior to this visit she had lost 50lbs. She eliminated sodas from her diet, and continued to be conscientious of maintaining a healthy diet. No issues with edema since losing weight. She also had some concerns regarding snoring but denied being told she had PND and only reported daytime somnolence if she did not have sufficient sleep the night before.  Since her last visit she did have an at home sleep study and followed up with with Dr. Radford Pax on 06/20/2021.She was diagnosed with moderate obstructive sleep apnea with an AHI of 9.9/h and no nocturnal hypoxemia. It was recommended that she start using a CPAP at this visit.  Today:  She reports that she started her CPAP in June. She has adjusted to it and reports that she is not feeling tired during the day or having headaches since  she started.   She is not having chest pain but she is having some isolated episodes where she is feeling palpitations. It will last a few minutes at a time and then she will rest and it will resolve. The episodes are not happening regularly (several times per month) and do not seem to be triggered by anything specific. It is not lasting for a long period of time.   She has continued working on her weight loss, but has recently hit a bit of a plateau. Her ultimate goal is to Anderson 180 lbs, but has been paused at her current level for a little while. She is currently on a 2.4 mg dose of Semaglutide. She wanted to confirm the process for weaning off the medication at the proper time. She has had no issue with the medication since transitioning to her current dose. Her recent blood work has improved greatly since her weight loss started.   Though she has continued with weight loss, she reports that she needs to exercise more. She is not able to walk as much as she would like due to bilateral removal of her Achilles.   The edema in her LE has significantly improved since her weight loss, despite ongoing issues at times due to the achilles procedures. She does continue to have some swelling if she is on her feet all day or walking a lot.   She denies any, chest pain or  shortness of breath. No lightheadedness, headaches, syncope, orthopnea, or PND.     Past Medical History:  Diagnosis Date   Endometriosis    Family history of adverse reaction to anesthesia    mom with complications   Hyperlipidemia    Paroxysmal atrial fibrillation ([place])    Prediabetes    Sleep apnea     Past Surgical History:  Procedure Laterality Date   ACHILLES TENDON SURGERY Bilateral    APPENDECTOMY     CHOLECYSTECTOMY     EXPLORATORY LAPAROTOMY     REPAIR EXTENSOR TENDON WITH METATARSAL OSTEOTOMY AND OPEN REDUCTION IN Right 03/29/2020   Procedure: OPEN TREATMENT OF RIGHT FIFTH METATARSAL WITH REPAIR OF NONUNION,  PERONEUS BREVIS TENOLYSIS;  Surgeon: Erle Crocker, MD;  Location: [place];  Service: Orthopedics;  Laterality: Right;  LENGTH OF SURGERY: 1.5 HOURS   ROTATOR CUFF REPAIR Left     Current Medications: Current Outpatient Medications on File Prior to Visit  Medication Sig   aspirin EC 81 MG tablet Take 1 tablet (81 mg total) by mouth daily. Swallow whole.   levonorgestrel (MIRENA) 20 MCG/24HR IUD 1 each by Intrauterine route once.   metoprolol tartrate (LOPRESSOR) 25 MG tablet Take 0.5 tablets (12.5 mg total) by mouth 2 (two) times daily.   nitroGLYCERIN (NITROSTAT) 0.4 MG SL tablet Place 1 tablet (0.4 mg total) under the tongue every 5 (five) minutes as needed for chest pain.   rosuvastatin (CRESTOR) 20 MG tablet Take 1 tablet (20 mg total) by mouth daily.   No current facility-administered medications on file prior to visit.     Allergies:   Ketorolac tromethamine, Macrobid [nitrofurantoin], and Dilaudid [hydromorphone]   Social History   Tobacco Use   Smoking status: Former   Smokeless tobacco: Never  Substance Use Topics   Alcohol use: Never   Drug use: Never    Family History: family history includes Atrial fibrillation in her maternal grandfather; Cancer in her paternal grandmother; Heart attack in her father; Heart block in her maternal grandmother; Hypothyroidism in her maternal grandmother; Stroke in her mother. Her father had a heart attack and hyperlipidemia. Her paternal grandmother had hyperlipidemia. Her grandfather had atrial fibrillation and a stroke.  ROS:   Please see the history of present illness. (+) Palpitations All other systems are reviewed and negative.    EKGs/Labs/Other Studies Reviewed:    The following studies were reviewed today:  Cardiac CTA 09/19/2020: FINDINGS: Non-cardiac: See separate report from [facility].   Pulmonary veins drain normally to the left atrium. No LA appendage thrombus noted.   Calcium  Score: 154 Agatston units.   Coronary Arteries: Right dominant with no anomalies   LM: No plaque or stenosis.   LAD system: Proximal mixed plaque with mild (<50%) stenosis.   Circumflex system: Small vessel, no plaque or stenosis.   RCA system: No plaque or stenosis.   IMPRESSION: 1. Coronary artery calcium score 154 Agatston units. This places the patient in the 97th percentile for age and gender, suggesting high risk for future cardiac events.   2.  Nonobstructive proximal LAD stenosis.  Echo 09/10/2020:  1. Left ventricular ejection fraction, by estimation, is 65 to 70%. The  left ventricle has normal function. The left ventricle has no regional  wall motion abnormalities. There is moderate concentric left ventricular  hypertrophy. Left ventricular  diastolic parameters are consistent with Grade I diastolic dysfunction  (impaired relaxation).   2. Right ventricular systolic function is normal. The right ventricular  size is normal. There is mildly elevated pulmonary artery systolic  pressure.   3. The mitral valve is normal in structure. No evidence of mitral valve  regurgitation. No evidence of mitral stenosis.   4. The aortic valve is tricuspid. Aortic valve regurgitation is not  visualized. No aortic stenosis is present.   5. The inferior vena cava is normal in size with greater than 50%  respiratory variability, suggesting right atrial pressure of 3 mmHg.   Comparison(s): No prior Echocardiogram.    EKG:  EKG is personally reviewed.   10/10/2021:not ordered today 04/01/2021: NSR at 66 bpm, PRWP 09/26/2020: sinus bradycardia at 58 bpm, PRWP  Recent Labs: 09/19/2021: ALT 16; BUN 20; Creatinine, Ser 0.66; Hemoglobin 12.8; Platelets 294.0; Potassium 4.0; Sodium 140; TSH 0.83   Recent Lipid Panel    Component Value Date/Time   CHOL 110 09/19/2021 1125   CHOL 116 02/26/2021 1008   TRIG 67.0 09/19/2021 1125   HDL 47.10 09/19/2021 1125   HDL 45 02/26/2021 1008    CHOLHDL 2 09/19/2021 1125   VLDL 13.4 09/19/2021 1125   LDLCALC 50 09/19/2021 1125   LDLCALC 52 02/26/2021 1008    Physical Exam:    VS:  BP 132/80 (BP Location: Left Arm, Patient Position: Sitting, Cuff Size: Large)   Pulse (!) 57   Ht $R'5\' 6"'fK$  (1.676 m)   Wt 209 lb 4.8 oz (94.9 kg)   SpO2 99%   BMI 33.78 kg/m     Wt Readings from Last 3 Encounters:  10/10/21 209 lb 4.8 oz (94.9 kg)  09/19/21 209 lb 9.6 oz (95.1 kg)  06/20/21 218 lb (98.9 kg)    GEN: Well nourished, well developed in no acute distress HEENT: Normal, moist mucous membranes NECK: No JVD CARDIAC: regular rhythm, normal S1 and S2, no rubs or gallops. No murmur. VASCULAR: Radial and DP pulses 2+ bilaterally. No carotid bruits RESPIRATORY:  Clear to auscultation without rales, wheezing or rhonchi  ABDOMEN: Soft, non-tender, non-distended MUSCULOSKELETAL:  Ambulates independently SKIN: Warm and dry, no edema NEUROLOGIC:  Alert and oriented x 3. No focal neuro deficits noted. PSYCHIATRIC:  Normal affect    ASSESSMENT:    1. OSA (obstructive sleep apnea)   2. Paroxysmal atrial fibrillation (HCC)   3. Nonobstructive atherosclerosis of coronary artery   4. Hypercholesterolemia   5. Morbid obesity (HCC)      PLAN:    Paroxysmal atrial fibrillation -intermittent but brief palpitations, none recently lasting longer than several minutes -chadsvasc=2 (female, nonobstructive CAD), not on anticoagulation as one of the points is for gender. Discussed that if this becomes more frequent or longer duration, I would re-evaluate for anticoagulation. Discussed that aspirin is not helpful for clots from afib -tolerating metoprolol well, she can take additional dose PRN for afib -discussed when to call office/present to ER.   Obstructive sleep apnea -now on CPAP, tolerating better  Nonobstructive CAD Hypercholesterolemia Obesity, BMI 45->33 Impaired glucose tolerance -continue aspirin and rosuvastatin -doing well on  GLP1RA, at 2.4 mg weekly dose. When she gets close to goal, she will contact me and we will gradually wean -reviewed red flag warning signs that need immediate medical attention  Cardiac risk counseling and prevention recommendations: -recommend heart healthy/Mediterranean diet, with whole grains, fruits, vegetable, fish, lean meats, nuts, and olive oil. Limit salt. -recommend moderate walking, 3-5 times/week for 30-50 minutes each session. Aim for at least 150 minutes.week. Goal should be pace of 3 miles/hours, or walking 1.5 miles in 30 minutes -recommend  avoidance of tobacco products. Avoid excess alcohol.  Plan for follow up: 1 year, sooner if palpitations become more frequent or longer in duration.   Buford Dresser, MD, PhD, [facility]    Medication Adjustments/Labs and Tests Ordered: Current medicines are reviewed at length with the patient today.  Concerns regarding medicines are outlined above.   No orders of the defined types were placed in this encounter.  Meds ordered this encounter  Medications   Semaglutide-Weight Management 2.4 MG/0.75ML SOAJ    Sig: Inject 2.4 mg into the skin once a week.    Dispense:  3 mL    Refill:  11   Patient Instructions  Medication Instructions:  Your Physician recommend you continue on your current medication as directed.    *If you need a refill on your cardiac medications before your next appointment, please call your pharmacy*   Lab Work: None ordered today   Testing/Procedures: None ordered today   Follow-Up: At [facility], you and your health needs are our priority.  As part of our continuing mission to provide you with exceptional heart care, we have created designated Provider Care Teams.  These Care Teams include your primary Cardiologist (physician) and Advanced Practice Providers (APPs -  Physician Assistants and Nurse Practitioners) who all work together to provide you with the care  you need, when you need it.  We recommend signing up for the patient portal called "MyChart".  Sign up information is provided on this After Visit Summary.  MyChart is used to connect with patients for Virtual Visits (Telemedicine).  Patients are able to view lab/test results, encounter notes, upcoming appointments, etc.  Non-urgent messages can be sent to your provider as well.   To learn more about what you can do with MyChart, go to [email or web address].    Your next appointment:   1 year(s)  The format for your next appointment:   In Person  Provider:   Buford Dresser, MD             Burnett Kanaris Ford,acting as a scribe for Buford Dresser, MD.,have documented all relevant documentation on the behalf of Buford Dresser, MD,as directed by  Buford Dresser, MD while in the presence of Buford Dresser, MD.   I, Buford Dresser, MD, have reviewed all documentation for this visit. The documentation on 10/10/21 for the exam, diagnosis, procedures, and orders are all accurate and complete.   Signed, Buford Dresser, MD PhD 10/10/2021 12:45 PM    [place]

## 2021-10-10 NOTE — Patient Instructions (Signed)
Medication Instructions:  Your Physician recommend you continue on your current medication as directed.    *If you need a refill on your cardiac medications before your next appointment, please call your pharmacy*   Lab Work: None ordered today   Testing/Procedures: None ordered today   Follow-Up: At [facility], you and your health needs are our priority.  As part of our continuing mission to provide you with exceptional heart care, we have created designated Provider Care Teams.  These Care Teams include your primary Cardiologist (physician) and Advanced Practice Providers (APPs -  Physician Assistants and Nurse Practitioners) who all work together to provide you with the care you need, when you need it.  We recommend signing up for the patient portal called "MyChart".  Sign up information is provided on this After Visit Summary.  MyChart is used to connect with patients for Virtual Visits (Telemedicine).  Patients are able to view lab/test results, encounter notes, upcoming appointments, etc.  Non-urgent messages can be sent to your provider as well.   To learn more about what you can do with MyChart, go to [email or web address].    Your next appointment:   1 year(s)  The format for your next appointment:   In Person  Provider:   Bridgette Christopher, MD          

## 2021-10-15 ENCOUNTER — Other Ambulatory Visit (HOSPITAL_COMMUNITY): Payer: Self-pay

## 2021-10-16 ENCOUNTER — Encounter: Payer: Self-pay | Admitting: Cardiology

## 2021-10-16 ENCOUNTER — Ambulatory Visit: Payer: 59 | Attending: Cardiology | Admitting: Cardiology

## 2021-10-16 VITALS — BP 132/90 | HR 56 | Ht 66.0 in | Wt 209.0 lb

## 2021-10-16 DIAGNOSIS — G4733 Obstructive sleep apnea (adult) (pediatric): Secondary | ICD-10-CM

## 2021-10-16 NOTE — Patient Instructions (Signed)
Medication Instructions:  Your physician recommends that you continue on your current medications as directed. Please refer to the Current Medication list given to you today.  *If you need a refill on your cardiac medications before your next appointment, please call your pharmacy*  Follow-Up: At [facility], you and your health needs are our priority.  As part of our continuing mission to provide you with exceptional heart care, we have created designated Provider Care Teams.  These Care Teams include your primary Cardiologist (physician) and Advanced Practice Providers (APPs -  Physician Assistants and Nurse Practitioners) who all work together to provide you with the care you need, when you need it.  Your next appointment:   1 year(s)  The format for your next appointment:   In Person  Provider:   Traci Turner, MD     Important Information About Sugar       

## 2021-10-16 NOTE — Progress Notes (Signed)
Sleep Medicine Consult Note:    Date:  10/16/2021   ID:  Nicole Anderson, DOB [birth year], MRN [id number]  PCP:  Charyl Dancer, NP  Cardiologist:  Buford Dresser, MD    Referring MD: Buford Dresser, MD  Chief Complaint  Patient presents with   Sleep Apnea    History of Present Illness:    Nicole Anderson is a 56 y.o. female with a hx of obesity who was referred for evaluation of OSA by Buford Dresser, MD.  The patient underwent home sleep study due to paroxysmal atrial fibrillation, obesity and excessive daytime sleepiness.  She recently told Dr. Harrell Gave that she would feel tired during the day but only if she did not get sufficient sleep the night before.  She also has been told that she snores and sometimes caught her self snoring which would awaken her.  She underwent home sleep study showing mild obstructive sleep apnea with an AHI of 9.9/h and no central events.  There was no evidence of nocturnal hypoxemia.    At last office visit we discussed options for treatment including aggressive weight loss, practicing good sleep hygiene and avoiding sleeping supine versus referral to dentistry for oral device versus a trial of CPAP therapy.  Given that her PAF has occurred only at night I suspected that her OSA was triggering PAF and we decided to place her on auto CPAP from 4 to 15 cm H2O.  She is now back for follow-up.  She is doing well with her PAP device and thinks that she has gotten used to it.  She tolerates the nasal pillow mask and feels the pressure is adequate.  Since going on PAP she feels rested in the am and has no significant daytime sleepiness.  She says that she only gets 6 hours of sleep nightly because she automatically wakes up.  She denies any significant mouth or nasal dryness or nasal congestion.  She does not think that he snores.    Past Medical History:  Diagnosis Date   Endometriosis    Family history of adverse reaction to anesthesia     mom with complications   Hyperlipidemia    Paroxysmal atrial fibrillation ([place])    Prediabetes    Sleep apnea     Past Surgical History:  Procedure Laterality Date   ACHILLES TENDON SURGERY Bilateral    APPENDECTOMY     CHOLECYSTECTOMY     EXPLORATORY LAPAROTOMY     REPAIR EXTENSOR TENDON WITH METATARSAL OSTEOTOMY AND OPEN REDUCTION IN Right 03/29/2020   Procedure: OPEN TREATMENT OF RIGHT FIFTH METATARSAL WITH REPAIR OF NONUNION, PERONEUS BREVIS TENOLYSIS;  Surgeon: Erle Crocker, MD;  Location: [facility];  Service: Orthopedics;  Laterality: Right;  LENGTH OF SURGERY: 1.5 HOURS   ROTATOR CUFF REPAIR Left     Current Medications: Current Meds  Medication Sig   aspirin EC 81 MG tablet Take 1 tablet (81 mg total) by mouth daily. Swallow whole.   levonorgestrel (MIRENA) 20 MCG/24HR IUD 1 each by Intrauterine route once.   metoprolol tartrate (LOPRESSOR) 25 MG tablet Take 0.5 tablets (12.5 mg total) by mouth 2 (two) times daily.   nitroGLYCERIN (NITROSTAT) 0.4 MG SL tablet Place 1 tablet (0.4 mg total) under the tongue every 5 (five) minutes as needed for chest pain.   rosuvastatin (CRESTOR) 20 MG tablet Take 1 tablet (20 mg total) by mouth daily.   Semaglutide-Weight Management 2.4 MG/0.75ML SOAJ Inject 2.4 mg into the skin once a  week.     Allergies:   Ketorolac tromethamine, Macrobid [nitrofurantoin], and Dilaudid [hydromorphone]   Social History   Socioeconomic History   Marital status: Married    Spouse name: Not on file   Number of children: Not on file   Years of education: Not on file   Highest education level: Not on file  Occupational History   Not on file  Tobacco Use   Smoking status: Former   Smokeless tobacco: Never  Substance and Sexual Activity   Alcohol use: Never   Drug use: Never   Sexual activity: Not on file  Other Topics Concern   Not on file  Social History Narrative   Not on file   Social Determinants of Health    Financial Resource Strain: Not on file  Food Insecurity: Not on file  Transportation Needs: Not on file  Physical Activity: Not on file  Stress: Not on file  Social Connections: Not on file     Family History: The patient's family history includes Atrial fibrillation in her maternal grandfather; Cancer in her paternal grandmother; Heart attack in her father; Heart block in her maternal grandmother; Hypothyroidism in her maternal grandmother; Stroke in her mother.  ROS:   Please see the history of present illness.    ROS  All other systems reviewed and negative.   EKGs/Labs/Other Studies Reviewed:    The following studies were reviewed today: Home sleep study  EKG:  EKG is not ordered today.    Recent Labs: 09/19/2021: ALT 16; BUN 20; Creatinine, Ser 0.66; Hemoglobin 12.8; Platelets 294.0; Potassium 4.0; Sodium 140; TSH 0.83   Recent Lipid Panel    Component Value Date/Time   CHOL 110 09/19/2021 1125   CHOL 116 02/26/2021 1008   TRIG 67.0 09/19/2021 1125   HDL 47.10 09/19/2021 1125   HDL 45 02/26/2021 1008   CHOLHDL 2 09/19/2021 1125   VLDL 13.4 09/19/2021 1125   LDLCALC 50 09/19/2021 1125   LDLCALC 52 02/26/2021 1008    CHA2DS2-VASc Score =   '[ ]'$ .  Therefore, the patient's annual risk of stroke is   %.        Physical Exam:    VS:  BP (!) 132/90   Pulse (!) 56   Ht '5\' 6"'$  (1.676 m)   Wt 209 lb (94.8 kg)   SpO2 97%   BMI 33.73 kg/m     Wt Readings from Last 3 Encounters:  10/16/21 209 lb (94.8 kg)  10/10/21 209 lb 4.8 oz (94.9 kg)  09/19/21 209 lb 9.6 oz (95.1 kg)     GEN: Well nourished, well developed in no acute distress HEENT: Normal NECK: No JVD; No carotid bruits LYMPHATICS: No lymphadenopathy CARDIAC:RRR, no murmurs, rubs, gallops RESPIRATORY:  Clear to auscultation without rales, wheezing or rhonchi  ABDOMEN: Soft, non-tender, non-distended MUSCULOSKELETAL:  No edema; No deformity  SKIN: Warm and dry NEUROLOGIC:  Alert and oriented x  3 PSYCHIATRIC:  Normal affect  ASSESSMENT:    1. Obstructive sleep apnea syndrome    PLAN:    In order of problems listed above:  OSA - The patient is tolerating PAP therapy well without any problems. The PAP download performed by his DME was personally reviewed and interpreted by me today and showed an AHI of 0.8 /hr on auto PAP from 4-15 cm H2O with 100% compliance in using more than 4 hours nightly.  The patient has been using and benefiting from PAP use and will continue to benefit from  therapy.    Medication Adjustments/Labs and Tests Ordered: Current medicines are reviewed at length with the patient today.  Concerns regarding medicines are outlined above.  No orders of the defined types were placed in this encounter.  No orders of the defined types were placed in this encounter.   Signed, Fransico Him, MD [facility], [place] Board of Sleep Medicine 10/16/2021 12:02 PM    [facility]/Sleep Medicine

## 2021-10-21 DIAGNOSIS — L820 Inflamed seborrheic keratosis: Secondary | ICD-10-CM | POA: Diagnosis not present

## 2021-10-21 DIAGNOSIS — L578 Other skin changes due to chronic exposure to nonionizing radiation: Secondary | ICD-10-CM | POA: Diagnosis not present

## 2021-10-21 DIAGNOSIS — Z08 Encounter for follow-up examination after completed treatment for malignant neoplasm: Secondary | ICD-10-CM | POA: Diagnosis not present

## 2021-10-21 DIAGNOSIS — C4441 Basal cell carcinoma of skin of scalp and neck: Secondary | ICD-10-CM | POA: Diagnosis not present

## 2021-10-21 DIAGNOSIS — Z85828 Personal history of other malignant neoplasm of skin: Secondary | ICD-10-CM | POA: Diagnosis not present

## 2021-10-21 DIAGNOSIS — D485 Neoplasm of uncertain behavior of skin: Secondary | ICD-10-CM | POA: Diagnosis not present

## 2021-10-22 DIAGNOSIS — G4733 Obstructive sleep apnea (adult) (pediatric): Secondary | ICD-10-CM | POA: Diagnosis not present

## 2021-10-23 ENCOUNTER — Other Ambulatory Visit (HOSPITAL_COMMUNITY): Payer: Self-pay

## 2021-11-06 ENCOUNTER — Other Ambulatory Visit (HOSPITAL_COMMUNITY): Payer: Self-pay

## 2021-11-11 ENCOUNTER — Encounter: Payer: Self-pay | Admitting: Nurse Practitioner

## 2021-11-11 ENCOUNTER — Other Ambulatory Visit (HOSPITAL_COMMUNITY): Payer: Self-pay

## 2021-11-11 ENCOUNTER — Ambulatory Visit: Payer: 59 | Admitting: Nurse Practitioner

## 2021-11-11 VITALS — BP 130/90 | HR 79 | Temp 96.2°F | Wt 207.2 lb

## 2021-11-11 DIAGNOSIS — U071 COVID-19: Secondary | ICD-10-CM

## 2021-11-11 DIAGNOSIS — R051 Acute cough: Secondary | ICD-10-CM

## 2021-11-11 LAB — POCT INFLUENZA A/B
Influenza A, POC: NEGATIVE
Influenza B, POC: NEGATIVE

## 2021-11-11 LAB — POC COVID19 BINAXNOW: SARS Coronavirus 2 Ag: POSITIVE — AB

## 2021-11-11 MED ORDER — NIRMATRELVIR/RITONAVIR (PAXLOVID)TABLET
3.0000 | ORAL_TABLET | Freq: Two times a day (BID) | ORAL | 0 refills | Status: AC
  Filled 2021-11-11: qty 30, 5d supply, fill #0

## 2021-11-11 NOTE — Patient Instructions (Signed)
It was great to see you!  Start paxlovid twice a day for 5 days. Stop your crestor while taking this medication. Isolate through tomorrow, and wear a mask if around others for 10 days. Isolate longer if you are still having fevers.   You can take your medications you are currently taking over the counter.   Let's follow-up if your symptoms worsen or don't improve.   Take care,  Vance Peper, NP

## 2021-11-11 NOTE — Progress Notes (Signed)
Acute Office Visit  Subjective:     Patient ID: Nicole Anderson, female    DOB: 03/19/1965, 56 y.o.   MRN: [id number]  Chief Complaint  Patient presents with   Acute Visit    Pt c/o intermittent fever, cough, body aches, headaches, sinus drainage, sore throat x3 days.At-home covid test neg 10/13.     HPI Patient is in today for fever, body aches, and headaches for 3 days. She had 2 negative home covid-19 tests.   UPPER RESPIRATORY TRACT INFECTION  Fever: yes Cough: yes Shortness of breath: no Wheezing: no Chest pain: no Chest tightness: no Chest congestion: no Nasal congestion: yes Runny nose: yes Post nasal drip: yes Sneezing: yes Sore throat: yes Swollen glands: no Sinus pressure: yes Headache: yes Face pain: no Toothache: no Ear pain: no bilateral Ear pressure: no bilateral Eyes red/itching:no Eye drainage/crusting: yes  Vomiting: no Rash: no Fatigue: yes Sick contacts: no Strep contacts: no  Context: stable Recurrent sinusitis: no Relief with OTC cold/cough medications: no  Treatments attempted: mucomyst, tylenol, motrin, nyquil    ROS See pertinent positives and negatives per HPI.     Objective:    BP (!) 130/90 (BP Location: Right Arm, Patient Position: Sitting)   Pulse 79   Temp (!) 96.2 F (35.7 C) (Temporal)   Wt 207 lb 3.2 oz (94 kg)   SpO2 99%   BMI 33.44 kg/m    Physical Exam Vitals and nursing note reviewed.  Constitutional:      General: She is not in acute distress.    Appearance: Normal appearance.  HENT:     Head: Normocephalic.     Right Ear: Tympanic membrane, ear canal and external ear normal.     Left Ear: Tympanic membrane, ear canal and external ear normal.  Eyes:     Conjunctiva/sclera: Conjunctivae normal.  Cardiovascular:     Rate and Rhythm: Normal rate and regular rhythm.     Pulses: Normal pulses.     Heart sounds: Normal heart sounds.  Pulmonary:     Effort: Pulmonary effort is normal.     Breath sounds:  Normal breath sounds.  Musculoskeletal:     Cervical back: Normal range of motion and neck supple. No tenderness.  Lymphadenopathy:     Cervical: No cervical adenopathy.  Skin:    General: Skin is warm.  Neurological:     General: No focal deficit present.     Mental Status: She is alert and oriented to person, place, and time.  Psychiatric:        Mood and Affect: Mood normal.        Behavior: Behavior normal.        Thought Content: Thought content normal.        Judgment: Judgment normal.     Results for orders placed or performed in visit on 11/11/21  POCT Influenza A/B  Result Value Ref Range   Influenza A, POC Negative Negative   Influenza B, POC Negative Negative  POC COVID-19 BinaxNow  Result Value Ref Range   SARS Coronavirus 2 Ag Positive (A) Negative        Assessment & Plan:   Problem List Items Addressed This Visit       Other   COVID-19 - Primary    She tested positive for COVID in the office today.  Symptoms started 3 days ago.  We will have her start Paxlovid twice daily for 5 days.  Hold her rosuvastatin while taking this  medication.  Encourage fluids, rest.  She continue the over-the-counter the medications that she is already taking.  Reviewed home care instructions for COVID. Advised self-isolation at home for at least 5 days. After 5 days, if improved and fever resolved, can be in public, but should wear a mask around others for an additional 5 days. If symptoms, esp, dyspnea develops/worsens, recommend in-person evaluation at either an urgent care or the emergency room.       Relevant Medications   nirmatrelvir/ritonavir EUA (PAXLOVID) 20 x 150 MG & 10 x '100MG'$  TABS   Other Visit Diagnoses     Acute cough       Point-of-care COVID-positive, flu negative.  See plan above for COVID.   Relevant Orders   POCT Influenza A/B (Completed)   POC COVID-19 BinaxNow (Completed)       Meds ordered this encounter  Medications   nirmatrelvir/ritonavir  EUA (PAXLOVID) 20 x 150 MG & 10 x '100MG'$  TABS    Sig: Take 3 tablets by mouth 2 (two) times daily for 5 days. (Take nirmatrelvir 150 mg two tablets twice daily for 5 days and ritonavir 100 mg one tablet twice daily for 5 days)    Dispense:  30 tablet    Refill:  0    Return if symptoms worsen or fail to improve.  Charyl Dancer, NP

## 2021-11-11 NOTE — Assessment & Plan Note (Signed)
She tested positive for COVID in the office today.  Symptoms started 3 days ago.  We will have her start Paxlovid twice daily for 5 days.  Hold her rosuvastatin while taking this medication.  Encourage fluids, rest.  She continue the over-the-counter the medications that she is already taking.  Reviewed home care instructions for COVID. Advised self-isolation at home for at least 5 days. After 5 days, if improved and fever resolved, can be in public, but should wear a mask around others for an additional 5 days. If symptoms, esp, dyspnea develops/worsens, recommend in-person evaluation at either an urgent care or the emergency room.

## 2021-11-19 ENCOUNTER — Other Ambulatory Visit (HOSPITAL_COMMUNITY): Payer: Self-pay

## 2021-11-20 DIAGNOSIS — G4733 Obstructive sleep apnea (adult) (pediatric): Secondary | ICD-10-CM | POA: Diagnosis not present

## 2021-11-21 DIAGNOSIS — G4733 Obstructive sleep apnea (adult) (pediatric): Secondary | ICD-10-CM | POA: Diagnosis not present

## 2021-11-29 ENCOUNTER — Ambulatory Visit: Payer: 59 | Admitting: Obstetrics and Gynecology

## 2021-11-29 ENCOUNTER — Other Ambulatory Visit (HOSPITAL_COMMUNITY)
Admission: RE | Admit: 2021-11-29 | Discharge: 2021-11-29 | Disposition: A | Discharge status: Home / Self Care | Payer: 59 | Source: Ambulatory Visit | Attending: Obstetrics and Gynecology | Admitting: Obstetrics and Gynecology

## 2021-11-29 ENCOUNTER — Encounter: Payer: Self-pay | Admitting: Obstetrics and Gynecology

## 2021-11-29 VITALS — BP 134/63 | HR 54 | Ht 66.0 in | Wt 206.0 lb

## 2021-11-29 DIAGNOSIS — N816 Rectocele: Secondary | ICD-10-CM | POA: Diagnosis not present

## 2021-11-29 DIAGNOSIS — Z30432 Encounter for removal of intrauterine contraceptive device: Secondary | ICD-10-CM

## 2021-11-29 DIAGNOSIS — Z01419 Encounter for gynecological examination (general) (routine) without abnormal findings: Secondary | ICD-10-CM

## 2021-11-29 NOTE — Progress Notes (Signed)
ANNUAL EXAM Patient name: Nicole Anderson MRN [id number]  Date of birth: April 12, 1965 Chief Complaint:   Gynecologic Exam  History of Present Illness:   Nicole Anderson is a 56 y.o. being seen today for a routine annual exam. She has an IUD in place and would like it remove. Amenorrheic with IUD in place. She denies hot flashes and vaginal dryness. She has SUI with cough, though some improvement with significant weight loss on meds (~80lbs). She has constipation secondary to wegovy and notices increased posterior vaginal bulge when constipated. She has not done pelvic PT before. Mammograms are done with outside clinic in [place] due to long established care.   No LMP recorded. (Menstrual status: Perimenopausal).   Last pap 08/2014. Results were: NILM w/ HRHPV negative.  Last mammogram: 12/2020. Results were: normal.     09/19/2021   11:31 AM  Depression screen PHQ 2/9  Decreased Interest 0  Down, Depressed, Hopeless 0  PHQ - 2 Score 0  Altered sleeping 0  Tired, decreased energy 0  Change in appetite 0  Feeling bad or failure about yourself  0  Trouble concentrating 0  Moving slowly or fidgety/restless 0  Suicidal thoughts 0  PHQ-9 Score 0        09/19/2021   11:31 AM  GAD 7 : Generalized Anxiety Score  Nervous, Anxious, on Edge 0  Control/stop worrying 0  Worry too much - different things 0  Trouble relaxing 0  Restless 0  Easily annoyed or irritable 0  Afraid - awful might happen 0  Total GAD 7 Score 0     Review of Systems:   Pertinent items are noted in HPI Denies any headaches, blurred vision, fatigue, shortness of breath, chest pain, abdominal pain, abnormal vaginal discharge/itching/odor/irritation, problems with periods, bowel movements, urination, or intercourse unless otherwise stated above. Pertinent History Reviewed:  Reviewed past medical,surgical, social and family history.  Reviewed problem list, medications and allergies. Physical Assessment:   Vitals:    11/29/21 1003  BP: 134/63  Pulse: (!) 54  Weight: 206 lb 0.6 oz (93.5 kg)  Height: '5\' 6"'$  (1.676 m)  Body mass index is 33.26 kg/m.        Physical Examination:   General appearance - well appearing, and in no distress  Mental status - alert, oriented to person, place, and time  Psych:  She has a normal mood and affect  Skin - warm and dry, normal color, no suspicious lesions noted  Chest - effort normal, all lung fields clear to auscultation bilaterally  Heart - normal rate and regular rhythm  Breasts - breasts appear normal, no suspicious masses, no skin or nipple changes or axillary nodes  Abdomen - soft, nontender, nondistended, no masses or organomegaly  Pelvic -  VULVA: normal appearing vulva with no masses, tenderness or lesions   VAGINA: normal appearing vagina with normal color and discharge, no lesions   CERVIX: normal appearing cervix without discharge or lesions, no CMT Posterior wall noted at hymen with valsalva  Thin prep pap is done with HR HPV cotesting  Extremities:  No swelling or varicosities noted  Chaperone present for exam  IUD Removal  Patient identified, informed consent performed, consent signed.  Patient was in the dorsal lithotomy position, normal external genitalia was noted.  A speculum was placed in the patient's vagina, normal discharge was noted, no lesions. The cervix was visualized, no lesions, no abnormal discharge.  The strings of the IUD were visualized, grasped and  pulled using ring forceps. The IUD was removed in its entirety. . Patient tolerated the procedure well.     No results found for this or any previous visit (from the past 24 hour(s)).   Assessment & Plan:  1. Well woman exam with routine gynecological exam Pt now s/p IUD removal. Discussed monitoring for any resumption of vaginal bleeding and alert clinic if that is the case. Will follow up pap results. Continue routine health maintenance. Stage 2 posterior wall prolapse noted -  encouraged to use stool softeners and increase water intake to avoid constipation that can worsen prolapse.   No orders of the defined types were placed in this encounter.   Meds: No orders of the defined types were placed in this encounter.   Follow-up: Return for Annual GYN.  Darliss Cheney, MD 11/29/2021 10:18 AM

## 2021-12-04 LAB — CYTOLOGY - PAP
Comment: NEGATIVE
Diagnosis: NEGATIVE
High risk HPV: NEGATIVE

## 2021-12-10 DIAGNOSIS — C4441 Basal cell carcinoma of skin of scalp and neck: Secondary | ICD-10-CM | POA: Diagnosis not present

## 2021-12-10 DIAGNOSIS — D485 Neoplasm of uncertain behavior of skin: Secondary | ICD-10-CM | POA: Diagnosis not present

## 2021-12-10 DIAGNOSIS — L905 Scar conditions and fibrosis of skin: Secondary | ICD-10-CM | POA: Diagnosis not present

## 2021-12-10 DIAGNOSIS — D225 Melanocytic nevi of trunk: Secondary | ICD-10-CM | POA: Diagnosis not present

## 2021-12-16 ENCOUNTER — Other Ambulatory Visit (HOSPITAL_COMMUNITY): Payer: Self-pay

## 2022-01-06 ENCOUNTER — Other Ambulatory Visit: Payer: Self-pay

## 2022-01-06 ENCOUNTER — Other Ambulatory Visit (HOSPITAL_COMMUNITY): Payer: Self-pay

## 2022-01-06 ENCOUNTER — Other Ambulatory Visit: Payer: Self-pay | Admitting: Cardiology

## 2022-01-06 MED ORDER — METOPROLOL TARTRATE 25 MG PO TABS
12.5000 mg | ORAL_TABLET | Freq: Two times a day (BID) | ORAL | 2 refills | Status: DC
  Filled 2022-01-06: qty 90, 90d supply, fill #0
  Filled 2022-05-04 – 2022-05-05 (×2): qty 90, 90d supply, fill #1
  Filled 2022-07-29: qty 90, 90d supply, fill #2

## 2022-01-06 NOTE — Telephone Encounter (Signed)
Rx(s) sent to pharmacy electronically.  

## 2022-01-08 ENCOUNTER — Other Ambulatory Visit (HOSPITAL_COMMUNITY): Payer: Self-pay

## 2022-01-22 ENCOUNTER — Other Ambulatory Visit (HOSPITAL_COMMUNITY): Payer: Self-pay

## 2022-01-22 MED ORDER — MELOXICAM 15 MG PO TABS
15.0000 mg | ORAL_TABLET | Freq: Every day | ORAL | 0 refills | Status: DC
  Filled 2022-01-22: qty 30, 30d supply, fill #0

## 2022-01-23 DIAGNOSIS — Z1231 Encounter for screening mammogram for malignant neoplasm of breast: Secondary | ICD-10-CM | POA: Diagnosis not present

## 2022-01-24 LAB — HM MAMMOGRAPHY

## 2022-02-04 DIAGNOSIS — Z1211 Encounter for screening for malignant neoplasm of colon: Secondary | ICD-10-CM | POA: Diagnosis not present

## 2022-02-04 DIAGNOSIS — K5900 Constipation, unspecified: Secondary | ICD-10-CM | POA: Diagnosis not present

## 2022-02-06 ENCOUNTER — Other Ambulatory Visit (HOSPITAL_COMMUNITY): Payer: Self-pay

## 2022-02-06 MED ORDER — PEG 3350-KCL-NA BICARB-NACL 420 G PO SOLR
ORAL | 0 refills | Status: DC
  Filled 2022-02-06: qty 4000, 1d supply, fill #0

## 2022-02-07 ENCOUNTER — Other Ambulatory Visit (HOSPITAL_COMMUNITY): Payer: Self-pay

## 2022-02-09 LAB — HM COLONOSCOPY

## 2022-02-14 ENCOUNTER — Other Ambulatory Visit (HOSPITAL_COMMUNITY): Payer: Self-pay

## 2022-02-14 ENCOUNTER — Encounter (HOSPITAL_BASED_OUTPATIENT_CLINIC_OR_DEPARTMENT_OTHER): Payer: Self-pay

## 2022-02-14 ENCOUNTER — Telehealth: Payer: Self-pay | Admitting: Cardiology

## 2022-02-14 MED ORDER — SEMAGLUTIDE-WEIGHT MANAGEMENT 2.4 MG/0.75ML ~~LOC~~ SOAJ
2.4000 mg | SUBCUTANEOUS | 11 refills | Status: DC
  Filled 2022-02-14: qty 3, 28d supply, fill #0
  Filled 2022-03-10: qty 3, 28d supply, fill #1
  Filled 2022-04-18: qty 3, 28d supply, fill #2

## 2022-02-14 NOTE — Telephone Encounter (Signed)
Your prior authorization for Mancel Parsons has been approved! MORE INFO Personalized support and financial assistance may be available through the [employer] program. For more information, and to see program requirements, click on the More Info button to the right.  Message from plan: The request has been approved. The authorization is effective from 02/14/2022 to 02/14/2023, as long as the member is enrolled in their current health plan. The request was approved as submitted. This request has been approved for 10m per 28 days.A second prior authorization [id number] been entered for [facility]5mg'$ /0.524m this request is effective from 02/14/2022 and is valid until 02/14/2023 and is limited to 70m670mer 28 days.A third prior authorization [id number] been entered for [facility]1mg'$ /0.5mL39mhis request is effective from 02/14/2022 and is valid until 02/14/2023 and is limited to 70mL 3m 28 days.A fourth prior authorization [id number] entered for [facility]'1mg'$ /0.5mL, 70ms request is effective from 02/14/2022 and is valid until 02/14/2023 and is limited to 70mL pe68m8 days.A fifth prior authorization [id number] entered for [facility]'7mg'$ /0.75mL, t23mrequest is effective from 02/14/2022 and is valid until 02/14/2023 and is limited to 3mL per 70mdays. A written notification letter will follow with additional details.  Above information received via Covermymeds  Mychart message sent to patient

## 2022-02-14 NOTE — Telephone Encounter (Signed)
Pt c/o medication issue:  1. Name of Medication:   Semaglutide-Weight Management 2.4 MG/0.75ML SOAJ    2. How are you currently taking this medication (dosage and times per day)?   3. Are you having a reaction (difficulty breathing--STAT)?   4. What is your medication issue? Pt states she needs approval for this medication since [facility] employees changed insurances. She said the pharmacy sent over an approval that needs to be signed.

## 2022-02-26 DIAGNOSIS — Z1211 Encounter for screening for malignant neoplasm of colon: Secondary | ICD-10-CM | POA: Diagnosis not present

## 2022-02-26 DIAGNOSIS — K5730 Diverticulosis of large intestine without perforation or abscess without bleeding: Secondary | ICD-10-CM | POA: Diagnosis not present

## 2022-02-26 DIAGNOSIS — K648 Other hemorrhoids: Secondary | ICD-10-CM | POA: Diagnosis not present

## 2022-03-04 DIAGNOSIS — G4733 Obstructive sleep apnea (adult) (pediatric): Secondary | ICD-10-CM | POA: Diagnosis not present

## 2022-03-27 ENCOUNTER — Telehealth (HOSPITAL_BASED_OUTPATIENT_CLINIC_OR_DEPARTMENT_OTHER): Payer: Self-pay

## 2022-03-27 ENCOUNTER — Other Ambulatory Visit (HOSPITAL_COMMUNITY): Payer: Self-pay

## 2022-03-27 MED ORDER — SEMAGLUTIDE-WEIGHT MANAGEMENT 1.7 MG/0.75ML ~~LOC~~ SOAJ
1.7000 mg | SUBCUTANEOUS | 0 refills | Status: AC
  Filled 2022-03-27 – 2022-05-19 (×3): qty 3, 28d supply, fill #0

## 2022-03-27 NOTE — Telephone Encounter (Signed)
Copied from appointment request message, 1.'7mg'$  refill sent to pharm!   "===View-only below this line===   ----- Message -----      From:Nicole Anderson      Sent:03/27/2022  1:25 PM EST        [id number] Appointment Schedule Request Message List   Subject:Appointment Request  Thank you. I have 2 weeks left for 2.4 so I can take that and switch to 1.'7mg'$ . I do need the 1.7 called in   ----- Message -----      From: (proxy for Nurse Otilio Saber)      Sent:03/27/2022  8:49 AM EST        [id number] [address]   Subject:Appointment Request  Good morning,   I spoke with Dr. Harrell Gave she said do one last refill while you can get them at the 1.'7mg'$  dose, take that 4 the four weeks and then you can just stop!  Let me know if you need refills called in for the 1.'7mg'$  dose!   Best, Gerald Stabs, RN   ----- Message -----      Nicole Anderson      Sent:03/26/2022  5:24 PM EST        [id number] Appointment Schedule Request Message List   Subject:Appointment Request  2.4 mg since December 2022. Last time I saw Dr. Harrell Gave she said when I was ready we would taper. With [facility] notice, I have a month taper. Nicole Anderson   ----- Message -----      From: (proxy for Nurse Satira Anis)      Sent:03/26/2022  3:11 PM EST        [id number] [address]   Subject:Appointment Request  Good afternoon   What is your current dose and how long have you been on it?  Rip Harbour    ----- Message -----      From:Nicole Anderson      Sent:03/26/2022  1:32 PM EST        [id number] Appointment Schedule Request Message List   Subject:Appointment Request  Thank you. I will schedule closer to date.  I do have another ask. [facility] notified me today they will no longer support  prescription coverage of [facility] after April 28, 2022. I have a month notice and question how taper by then? Dr. Harrell Gave had told me I needed to taper and best note just stop. Please advise. Thanks, Nicole Anderson   ----- Message -----      From: (proxy for  Letta Kocher)      Sent:03/20/2022 10:35 AM EST        [id number] [address]   Subject:Appointment Request  It looks like you are not due to see Dr. Harrell Gave until September.  Do you want to schedule for then?   ----- Message -----      From:Nicole Anderson      Sent:03/20/2022 10:17 AM EST        [id number] Harrell Gave, MD   Subject:Appointment Request  Appointment Request From: Nicole Anderson  With Provider: Buford Dresser, MD [facility] [place]  Preferred Date Range: 03/31/2022 - 04/25/2022  Preferred Times: Any Time  Reason for visit: Office Visit  Comments: Yearly visit

## 2022-04-02 DIAGNOSIS — G4733 Obstructive sleep apnea (adult) (pediatric): Secondary | ICD-10-CM | POA: Diagnosis not present

## 2022-04-10 ENCOUNTER — Other Ambulatory Visit (HOSPITAL_COMMUNITY): Payer: Self-pay

## 2022-04-14 DIAGNOSIS — L578 Other skin changes due to chronic exposure to nonionizing radiation: Secondary | ICD-10-CM | POA: Diagnosis not present

## 2022-04-14 DIAGNOSIS — Z85828 Personal history of other malignant neoplasm of skin: Secondary | ICD-10-CM | POA: Diagnosis not present

## 2022-04-14 DIAGNOSIS — Z08 Encounter for follow-up examination after completed treatment for malignant neoplasm: Secondary | ICD-10-CM | POA: Diagnosis not present

## 2022-04-15 ENCOUNTER — Other Ambulatory Visit (HOSPITAL_COMMUNITY): Payer: Self-pay

## 2022-04-16 ENCOUNTER — Other Ambulatory Visit (HOSPITAL_COMMUNITY): Payer: Self-pay

## 2022-04-18 ENCOUNTER — Other Ambulatory Visit (HOSPITAL_COMMUNITY): Payer: Self-pay

## 2022-05-03 DIAGNOSIS — G4733 Obstructive sleep apnea (adult) (pediatric): Secondary | ICD-10-CM | POA: Diagnosis not present

## 2022-05-05 ENCOUNTER — Other Ambulatory Visit (HOSPITAL_COMMUNITY): Payer: Self-pay

## 2022-05-05 ENCOUNTER — Other Ambulatory Visit: Payer: Self-pay

## 2022-05-06 ENCOUNTER — Other Ambulatory Visit: Payer: Self-pay

## 2022-05-19 ENCOUNTER — Other Ambulatory Visit (HOSPITAL_COMMUNITY): Payer: Self-pay

## 2022-06-02 DIAGNOSIS — G4733 Obstructive sleep apnea (adult) (pediatric): Secondary | ICD-10-CM | POA: Diagnosis not present

## 2022-07-03 DIAGNOSIS — G4733 Obstructive sleep apnea (adult) (pediatric): Secondary | ICD-10-CM | POA: Diagnosis not present

## 2022-08-12 ENCOUNTER — Ambulatory Visit: Payer: Commercial Managed Care - PPO | Admitting: Nurse Practitioner

## 2022-08-12 ENCOUNTER — Encounter: Payer: Self-pay | Admitting: Nurse Practitioner

## 2022-08-12 ENCOUNTER — Other Ambulatory Visit (HOSPITAL_COMMUNITY): Payer: Self-pay

## 2022-08-12 VITALS — BP 128/82 | HR 49 | Temp 97.0°F | Ht 66.0 in | Wt 213.0 lb

## 2022-08-12 DIAGNOSIS — G4733 Obstructive sleep apnea (adult) (pediatric): Secondary | ICD-10-CM | POA: Diagnosis not present

## 2022-08-12 DIAGNOSIS — E669 Obesity, unspecified: Secondary | ICD-10-CM

## 2022-08-12 DIAGNOSIS — Z0000 Encounter for general adult medical examination without abnormal findings: Secondary | ICD-10-CM | POA: Diagnosis not present

## 2022-08-12 DIAGNOSIS — E041 Nontoxic single thyroid nodule: Secondary | ICD-10-CM

## 2022-08-12 DIAGNOSIS — Z23 Encounter for immunization: Secondary | ICD-10-CM | POA: Diagnosis not present

## 2022-08-12 DIAGNOSIS — R7303 Prediabetes: Secondary | ICD-10-CM | POA: Diagnosis not present

## 2022-08-12 DIAGNOSIS — E7800 Pure hypercholesterolemia, unspecified: Secondary | ICD-10-CM

## 2022-08-12 DIAGNOSIS — I480 Paroxysmal atrial fibrillation: Secondary | ICD-10-CM | POA: Diagnosis not present

## 2022-08-12 MED ORDER — CONTRAVE 8-90 MG PO TB12
ORAL_TABLET | ORAL | 0 refills | Status: DC
Start: 2022-08-12 — End: 2022-09-22
  Filled 2022-08-12: qty 120, 63d supply, fill #0

## 2022-08-12 NOTE — Assessment & Plan Note (Signed)
History of elevated cholesterol that is well controlled on rosuvastatin 20 mg daily.  We will check lipid panel today.

## 2022-08-12 NOTE — Assessment & Plan Note (Signed)
Health maintenance reviewed and updated. Discussed nutrition, exercise. Check CMP, CBC today. Follow-up 1 year.   

## 2022-08-12 NOTE — Assessment & Plan Note (Signed)
She was recently diagnosed with mild sleep apnea is using his CPAP every night.  Continue using this to help control symptoms along with A-fib.

## 2022-08-12 NOTE — Assessment & Plan Note (Signed)
Biopsy came back benign.  Will check TSH today

## 2022-08-12 NOTE — Assessment & Plan Note (Signed)
Chronic, stable.  She has been losing weight with Wegovy until her insurance stopped covering this.  Will check an A1c and treat based on results.

## 2022-08-12 NOTE — Assessment & Plan Note (Signed)
She has a history of paroxysmal A-fib.  This is well controlled and she follows with cardiology.  Continue aspirin 81 mg daily and metoprolol 12.5 mg twice a day.  Continue collaboration recommendations from cardiology.

## 2022-08-12 NOTE — Progress Notes (Signed)
BP 128/82 (BP Location: Left Arm)   Pulse (!) 49   Temp (!) 97 F (36.1 C)   Ht 5\' 6"  (1.676 m)   Wt 213 lb (96.6 kg)   SpO2 100%   BMI 34.38 kg/m    Subjective:    Patient ID: Nicole Anderson, female    DOB: Feb 29, 1965, 57 y.o.   MRN: [id number]  CC: Chief Complaint  Patient presents with   Annual Exam    Patient is not fasting, discuss weight loss options    HPI: Nicole Anderson is a 57 y.o. female presenting on 08/12/2022 for comprehensive medical examination. Current medical complaints include: Weight management  She was taking Wegovy 2.4 mg injection weekly for weight management, however her insurance stopped paying for this.  She states that since being off the medication for a few months, she has gained back about 10 pounds.  She is interested in other medications to help with weight loss.  She is still trying to watch what she is eating and exercise.  She currently lives with: husband  Menopausal Symptoms: no  Depression and Anxiety Screen done today and results listed below:     08/12/2022    9:21 AM 09/19/2021   11:31 AM  Depression screen PHQ 2/9  Decreased Interest 0 0  Down, Depressed, Hopeless 0 0  PHQ - 2 Score 0 0  Altered sleeping 0 0  Tired, decreased energy 0 0  Change in appetite 0 0  Feeling bad or failure about yourself  0 0  Trouble concentrating 0 0  Moving slowly or fidgety/restless 0 0  Suicidal thoughts 0 0  PHQ-9 Score 0 0  Difficult doing work/chores Not difficult at all       08/12/2022    9:21 AM 09/19/2021   11:31 AM  GAD 7 : Generalized Anxiety Score  Nervous, Anxious, on Edge 0 0  Control/stop worrying 0 0  Worry too much - different things 0 0  Trouble relaxing 0 0  Restless 0 0  Easily annoyed or irritable 0 0  Afraid - awful might happen 0 0  Total GAD 7 Score 0 0  Anxiety Difficulty Not difficult at all     The patient has a history of falls. I did complete a risk assessment for falls. A plan of care for falls was  documented.   Past Medical History:  Past Medical History:  Diagnosis Date   Endometriosis    Family history of adverse reaction to anesthesia    mom with complications   Hyperlipidemia    Paroxysmal atrial fibrillation (HCC)    Prediabetes    Sleep apnea    mild with AHI 9.9/hr on auto CPAP    Surgical History:  Past Surgical History:  Procedure Laterality Date   ACHILLES TENDON SURGERY Bilateral    APPENDECTOMY     CHOLECYSTECTOMY     EXPLORATORY LAPAROTOMY     REPAIR EXTENSOR TENDON WITH METATARSAL OSTEOTOMY AND OPEN REDUCTION IN Right 03/29/2020   Procedure: OPEN TREATMENT OF RIGHT FIFTH METATARSAL WITH REPAIR OF NONUNION, PERONEUS BREVIS TENOLYSIS;  Surgeon: Terance Hart, MD;  Location: [facility];  Service: Orthopedics;  Laterality: Right;  LENGTH OF SURGERY: 1.5 HOURS   ROTATOR CUFF REPAIR Left     Medications:  Current Outpatient Medications on File Prior to Visit  Medication Sig   metoprolol tartrate (LOPRESSOR) 25 MG tablet Take 0.5 tablets (12.5 mg total) by mouth 2 (two) times daily.  rosuvastatin (CRESTOR) 20 MG tablet Take 1 tablet (20 mg total) by mouth daily.   aspirin EC 81 MG tablet Take 1 tablet (81 mg total) by mouth daily. Swallow whole. (Patient not taking: Reported on 08/12/2022)   meloxicam (MOBIC) 15 MG tablet Take 1 tablet by mouth once a day (Patient not taking: Reported on 08/12/2022)   nitroGLYCERIN (NITROSTAT) 0.4 MG SL tablet Place 1 tablet (0.4 mg total) under the tongue every 5 (five) minutes as needed for chest pain. (Patient not taking: Reported on 08/12/2022)   No current facility-administered medications on file prior to visit.    Allergies:  Allergies  Allergen Reactions   Ketorolac Tromethamine Rash   Macrobid [Nitrofurantoin] Rash   Dilaudid [Hydromorphone] Itching    Social History:  Social History   Socioeconomic History   Marital status: Married    Spouse name: Not on file   Number of children: Not  on file   Years of education: Not on file   Highest education level: Not on file  Occupational History   Not on file  Tobacco Use   Smoking status: Former   Smokeless tobacco: Never  Substance and Sexual Activity   Alcohol use: Never   Drug use: Never   Sexual activity: Not on file  Other Topics Concern   Not on file  Social History Narrative   Not on file   Social Determinants of Health   Financial Resource Strain: Not on file  Food Insecurity: Not on file  Transportation Needs: Not on file  Physical Activity: Not on file  Stress: Not on file  Social Connections: Not on file  Intimate Partner Violence: Not on file   Social History   Tobacco Use  Smoking Status Former  Smokeless Tobacco Never   Social History   Substance and Sexual Activity  Alcohol Use Never    Family History:  Family History  Problem Relation Age of Onset   Stroke Mother    Heart attack Father    Hypothyroidism Maternal Grandmother    Heart block Maternal Grandmother    Atrial fibrillation Maternal Grandfather    Cancer Paternal Grandmother        breast and colon    Past medical history, surgical history, medications, allergies, family history and social history reviewed with patient today and changes made to appropriate areas of the chart.   Review of Systems  Constitutional: Negative.   HENT: Negative.    Respiratory: Negative.    Cardiovascular:  Positive for palpitations. Negative for chest pain.  Gastrointestinal: Negative.   Genitourinary: Negative.   Musculoskeletal: Negative.   Skin: Negative.   Neurological: Negative.   Psychiatric/Behavioral: Negative.     All other ROS negative except what is listed above and in the HPI.      Objective:    BP 128/82 (BP Location: Left Arm)   Pulse (!) 49   Temp (!) 97 F (36.1 C)   Ht 5\' 6"  (1.676 m)   Wt 213 lb (96.6 kg)   SpO2 100%   BMI 34.38 kg/m   Wt Readings from Last 3 Encounters:  08/12/22 213 lb (96.6 kg)   11/29/21 206 lb 0.6 oz (93.5 kg)  11/11/21 207 lb 3.2 oz (94 kg)    Physical Exam Vitals and nursing note reviewed.  Constitutional:      General: She is not in acute distress.    Appearance: Normal appearance. She is obese.  HENT:     Head: Normocephalic and atraumatic.  Right Ear: Tympanic membrane, ear canal and external ear normal.     Left Ear: Tympanic membrane, ear canal and external ear normal.  Eyes:     Conjunctiva/sclera: Conjunctivae normal.  Cardiovascular:     Rate and Rhythm: Normal rate and regular rhythm.     Pulses: Normal pulses.     Heart sounds: Normal heart sounds.  Pulmonary:     Effort: Pulmonary effort is normal.     Breath sounds: Normal breath sounds.  Abdominal:     Palpations: Abdomen is soft.     Tenderness: There is no abdominal tenderness.  Musculoskeletal:        General: Normal range of motion.     Cervical back: Normal range of motion and neck supple.     Right lower leg: No edema.     Left lower leg: No edema.  Lymphadenopathy:     Cervical: No cervical adenopathy.  Skin:    General: Skin is warm and dry.  Neurological:     General: No focal deficit present.     Mental Status: She is alert and oriented to person, place, and time.     Cranial Nerves: No cranial nerve deficit.     Coordination: Coordination normal.     Gait: Gait normal.  Psychiatric:        Mood and Affect: Mood normal.        Behavior: Behavior normal.        Thought Content: Thought content normal.        Judgment: Judgment normal.     Results for orders placed or performed in visit on 02/11/22  HM COLONOSCOPY  Result Value Ref Range   HM Colonoscopy See Report (in chart) See Report (in chart), Patient Reported      Assessment & Plan:   Problem List Items Addressed This Visit       Cardiovascular and Mediastinum   Paroxysmal A-fib (HCC)    She has a history of paroxysmal A-fib.  This is well controlled and she follows with cardiology.  Continue  aspirin 81 mg daily and metoprolol 12.5 mg twice a day.  Continue collaboration recommendations from cardiology.        Respiratory   Sleep apnea    She was recently diagnosed with mild sleep apnea is using his CPAP every night.  Continue using this to help control symptoms along with A-fib.        Endocrine   Thyroid nodule    Biopsy came back benign.  Will check TSH today      Relevant Orders   TSH     Other   Prediabetes    Chronic, stable.  She has been losing weight with Wegovy until her insurance stopped covering this.  Will check an A1c and treat based on results.      Relevant Orders   Hemoglobin A1c   Hypercholesterolemia    History of elevated cholesterol that is well controlled on rosuvastatin 20 mg daily.  We will check lipid panel today.      Relevant Orders   CBC with Differential/Platelet   Comprehensive metabolic panel   Lipid panel   Obesity (BMI 30-39.9)    BMI 34.3.  She states that she has lost 80 pounds with Wegovy until her insurance stopped covering it.  She has then gained back about 10 pounds since stopping it.  She is interested in a different medication to help with weight loss.  Will have her start Contrave 1  tablet daily for 7 days and then increase to 1 tablet twice a day.  Discussed possible side effects.  Follow-up in 6 to 8 weeks.      Relevant Medications   Naltrexone-buPROPion HCl ER (CONTRAVE) 8-90 MG TB12   Routine general medical examination at a health care facility - Primary    Health maintenance reviewed and updated. Discussed nutrition, exercise. Check CMP, CBC today. Follow-up 1 year.        Other Visit Diagnoses     Immunization due       Td booster given today   Relevant Orders   Td vaccine greater than or equal to 7yo preservative free IM (Completed)   Varicella-zoster vaccine IM (Completed)        Follow up plan: Return in about 3 months (around 11/12/2022) for weight management.   LABORATORY TESTING:  - Pap  smear: done elsewhere  IMMUNIZATIONS:   - Tdap: Tetanus vaccination status reviewed: Td vaccination indicated and given today. - Influenza: Postponed to flu season - Pneumovax: Not applicable - Prevnar: Not applicable - HPV: Not applicable - Zostavax vaccine: Not applicable  SCREENING: -Mammogram: Up to date  - Colonoscopy: Up to date  - Bone Density: Not applicable   PATIENT COUNSELING:   Advised to take 1 mg of folate supplement per day if capable of pregnancy.   Sexuality: Discussed sexually transmitted diseases, partner selection, use of condoms, avoidance of unintended pregnancy  and contraceptive alternatives.   Advised to avoid cigarette smoking.  I discussed with the patient that most people either abstain from alcohol or drink within safe limits (<=14/week and <=4 drinks/occasion for males, <=7/weeks and <= 3 drinks/occasion for females) and that the risk for alcohol disorders and other health effects rises proportionally with the number of drinks per week and how often a drinker exceeds daily limits.  Discussed cessation/primary prevention of drug use and availability of treatment for abuse.   Diet: Encouraged to adjust caloric intake to maintain  or achieve ideal body weight, to reduce intake of dietary saturated fat and total fat, to limit sodium intake by avoiding high sodium foods and not adding table salt, and to maintain adequate dietary potassium and calcium preferably from fresh fruits, vegetables, and low-fat dairy products.    stressed the importance of regular exercise  Injury prevention: Discussed safety belts, safety helmets, smoke detector, smoking near bedding or upholstery.   Dental health: Discussed importance of regular tooth brushing, flossing, and dental visits.    NEXT PREVENTATIVE PHYSICAL DUE IN 1 YEAR. Return in about 3 months (around 11/12/2022) for weight management.

## 2022-08-12 NOTE — Assessment & Plan Note (Signed)
BMI 34.3.  She states that she has lost 80 pounds with Wegovy until her insurance stopped covering it.  She has then gained back about 10 pounds since stopping it.  She is interested in a different medication to help with weight loss.  Will have her start Contrave 1 tablet daily for 7 days and then increase to 1 tablet twice a day.  Discussed possible side effects.  Follow-up in 6 to 8 weeks.

## 2022-08-12 NOTE — Patient Instructions (Signed)
It was great to see you!  Come back tomorrow for labs.  Start contrave 1 tablet daily for 7 days, then increase to 1 tablet twice a day. You can take the second dose around 1 or 2 pm.   Let's follow-up in 3 months, sooner if you have concerns.  If a referral was placed today, you will be contacted for an appointment. Please note that routine referrals can sometimes take up to 3-4 weeks to process. Please call our office if you haven't heard anything after this time frame.  Take care,  Rodman Pickle, NP

## 2022-08-15 ENCOUNTER — Other Ambulatory Visit (HOSPITAL_COMMUNITY): Payer: Self-pay

## 2022-08-18 ENCOUNTER — Other Ambulatory Visit (INDEPENDENT_AMBULATORY_CARE_PROVIDER_SITE_OTHER): Payer: Commercial Managed Care - PPO

## 2022-08-18 ENCOUNTER — Encounter: Payer: Self-pay | Admitting: Nurse Practitioner

## 2022-08-18 DIAGNOSIS — R7303 Prediabetes: Secondary | ICD-10-CM

## 2022-08-18 DIAGNOSIS — E7800 Pure hypercholesterolemia, unspecified: Secondary | ICD-10-CM | POA: Diagnosis not present

## 2022-08-18 LAB — LIPID PANEL
Cholesterol: 140 mg/dL (ref 0–200)
HDL: 61.7 mg/dL (ref >39.00)
LDL Cholesterol: 65 mg/dL (ref 0–99)
NonHDL: 78.44
Total CHOL/HDL Ratio: 2
Triglycerides: 68 mg/dL (ref 0.0–149.0)
VLDL: 13.6 mg/dL (ref 0.0–40.0)

## 2022-08-18 LAB — CBC WITH DIFFERENTIAL/PLATELET
Basophils Absolute: 0.1 10*3/uL (ref 0.0–0.1)
Basophils Relative: 1 % (ref 0.0–3.0)
Eosinophils Absolute: 0.2 10*3/uL (ref 0.0–0.7)
Eosinophils Relative: 2.6 % (ref 0.0–5.0)
HCT: 40.7 % (ref 36.0–46.0)
Hemoglobin: 13.1 g/dL (ref 12.0–15.0)
Lymphocytes Relative: 27.7 % (ref 12.0–46.0)
Lymphs Abs: 1.7 10*3/uL (ref 0.7–4.0)
MCHC: 32.1 g/dL (ref 30.0–36.0)
MCV: 94.1 fl (ref 78.0–100.0)
Monocytes Absolute: 0.5 10*3/uL (ref 0.1–1.0)
Monocytes Relative: 8.6 % (ref 3.0–12.0)
Neutro Abs: 3.7 10*3/uL (ref 1.4–7.7)
Neutrophils Relative %: 60.1 % (ref 43.0–77.0)
Platelets: 273 10*3/uL (ref 150.0–400.0)
RBC: 4.32 Mil/uL (ref 3.87–5.11)
RDW: 14 % (ref 11.5–15.5)
WBC: 6.1 10*3/uL (ref 4.0–10.5)

## 2022-08-18 LAB — COMPREHENSIVE METABOLIC PANEL
ALT: 23 U/L (ref 0–35)
AST: 14 U/L (ref 0–37)
Albumin: 4.1 g/dL (ref 3.5–5.2)
Alkaline Phosphatase: 73 U/L (ref 39–117)
BUN: 17 mg/dL (ref 6–23)
CO2: 27 mEq/L (ref 19–32)
Calcium: 9.3 mg/dL (ref 8.4–10.5)
Chloride: 105 mEq/L (ref 96–112)
Creatinine, Ser: 0.78 mg/dL (ref 0.40–1.20)
GFR: 84.19 mL/min (ref >60.00)
Glucose, Bld: 100 mg/dL — ABNORMAL HIGH (ref 70–99)
Potassium: 4.2 mEq/L (ref 3.5–5.1)
Sodium: 140 mEq/L (ref 135–145)
Total Bilirubin: 0.5 mg/dL (ref 0.2–1.2)
Total Protein: 6.4 g/dL (ref 6.0–8.3)

## 2022-08-18 LAB — HEMOGLOBIN A1C: Hgb A1c MFr Bld: 5.6 % (ref 4.6–6.5)

## 2022-08-20 ENCOUNTER — Other Ambulatory Visit (HOSPITAL_COMMUNITY): Payer: Self-pay

## 2022-08-21 ENCOUNTER — Telehealth: Payer: Self-pay

## 2022-08-21 ENCOUNTER — Other Ambulatory Visit (HOSPITAL_COMMUNITY): Payer: Self-pay

## 2022-08-21 NOTE — Telephone Encounter (Signed)
Pharmacy Patient Advocate Encounter  Received notification from [facility] that Prior Authorization for Contrave ER 8-90 MG has been APPROVED from 08/20/22 to 09/19/22. Ran test claim, Copay is $--- (Filled 08/20/22).  PA #/Case ID/Reference #: [id number]

## 2022-08-21 NOTE — Telephone Encounter (Signed)
I will send patient a mychart message of approval

## 2022-08-29 ENCOUNTER — Encounter: Payer: Self-pay | Admitting: Nurse Practitioner

## 2022-09-01 DIAGNOSIS — G4733 Obstructive sleep apnea (adult) (pediatric): Secondary | ICD-10-CM | POA: Diagnosis not present

## 2022-09-11 ENCOUNTER — Other Ambulatory Visit: Payer: Self-pay | Admitting: Cardiology

## 2022-09-11 ENCOUNTER — Other Ambulatory Visit (HOSPITAL_COMMUNITY): Payer: Self-pay

## 2022-09-11 ENCOUNTER — Other Ambulatory Visit: Payer: Self-pay | Admitting: Nurse Practitioner

## 2022-09-11 DIAGNOSIS — E785 Hyperlipidemia, unspecified: Secondary | ICD-10-CM

## 2022-09-11 MED ORDER — METOPROLOL TARTRATE 25 MG PO TABS
12.5000 mg | ORAL_TABLET | Freq: Two times a day (BID) | ORAL | 0 refills | Status: DC
  Filled 2022-09-11: qty 90, 90d supply, fill #0

## 2022-09-15 ENCOUNTER — Other Ambulatory Visit (HOSPITAL_COMMUNITY): Payer: Self-pay

## 2022-09-15 MED ORDER — ROSUVASTATIN CALCIUM 20 MG PO TABS
20.0000 mg | ORAL_TABLET | Freq: Every day | ORAL | 0 refills | Status: DC
Start: 2022-09-15 — End: 2023-03-13
  Filled 2022-09-15: qty 90, 90d supply, fill #0

## 2022-09-15 NOTE — Telephone Encounter (Signed)
Requesting: rosuvastatin (CRESTOR) 20 MG tablet  Last Visit: 08/12/2022 Next Visit: 11/12/2022 Last Refill: 09/19/2021  Please Advise

## 2022-09-16 ENCOUNTER — Other Ambulatory Visit (HOSPITAL_COMMUNITY): Payer: Self-pay

## 2022-09-22 ENCOUNTER — Other Ambulatory Visit: Payer: Self-pay | Admitting: Nurse Practitioner

## 2022-09-23 ENCOUNTER — Other Ambulatory Visit (HOSPITAL_COMMUNITY): Payer: Self-pay

## 2022-09-23 MED ORDER — CONTRAVE 8-90 MG PO TB12
1.0000 | ORAL_TABLET | Freq: Two times a day (BID) | ORAL | 0 refills | Status: DC
  Filled 2022-09-23 – 2022-09-24 (×2): qty 120, 60d supply, fill #0

## 2022-09-24 ENCOUNTER — Other Ambulatory Visit (HOSPITAL_COMMUNITY): Payer: Self-pay

## 2022-09-29 IMAGING — CT CT ANGIO CHEST
2 of 6 series · 19 of 36 positions shown · IV contrast (omnipaque)
Comparison: Portable chest 2229 hours today.

CLINICAL DATA: 54-year-old female with atrial fibrillation, chest
pain, malaise. RVR.

EXAM:
CT ANGIOGRAPHY CHEST WITH CONTRAST
TECHNIQUE: Multidetector CT imaging of the chest was performed using the
standard protocol during bolus administration of intravenous
contrast. Multiplanar CT image reconstructions and MIPs were
obtained to evaluate the vascular anatomy.
CONTRAST:  50mL OMNIPAQUE IOHEXOL 350 MG/ML SOLN

[Series 7: pe thins · axial · 0.72mm/px · z∈[-334,-89]mm · 18 of 391 slices shown]
[im 20/391  lung]
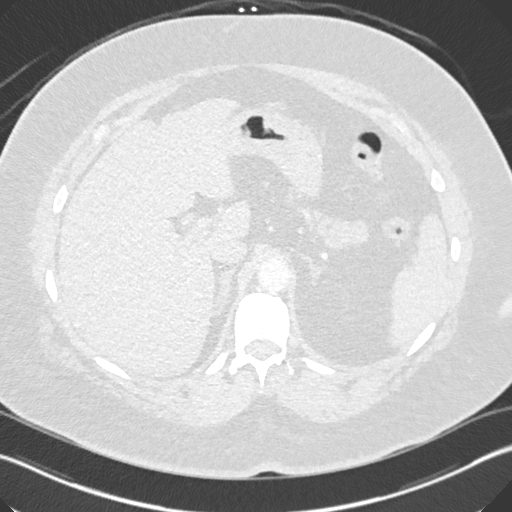
[im 40/391  mediastinal]
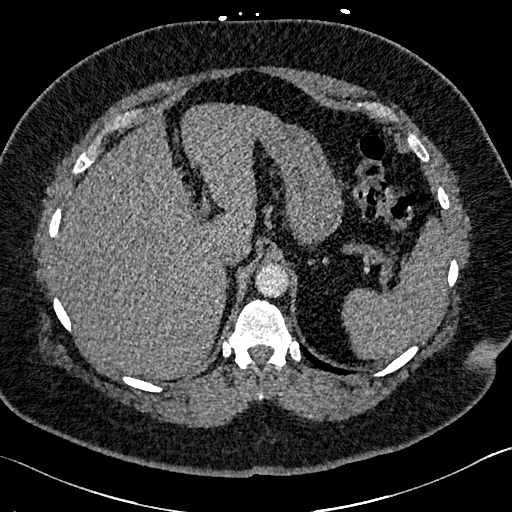
[im 59/391  lung]
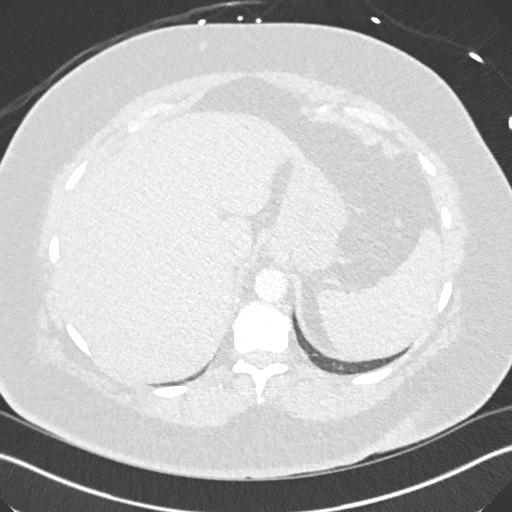
[im 79/391  mediastinal]
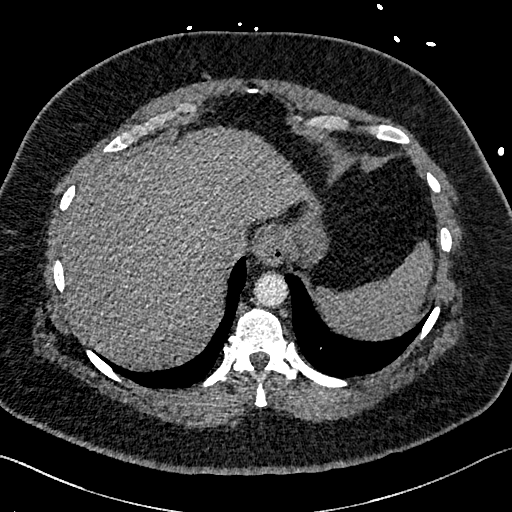
[im 98/391  lung]
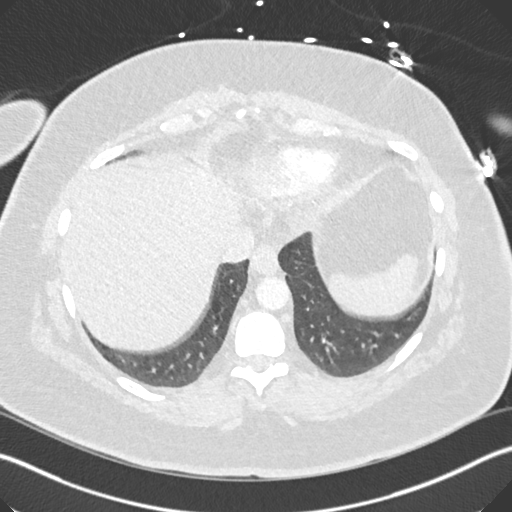
[im 118/391  mediastinal]
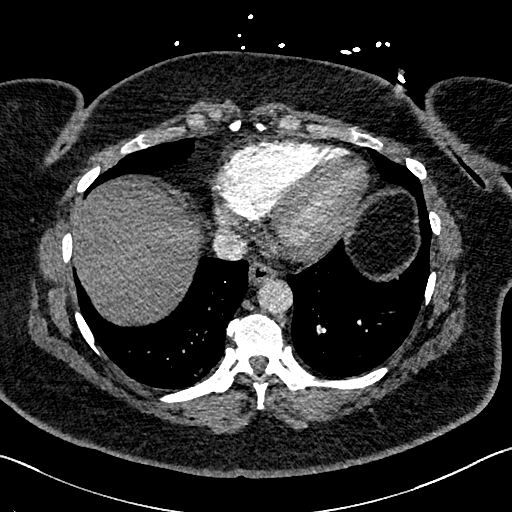
[im 137/391  lung]
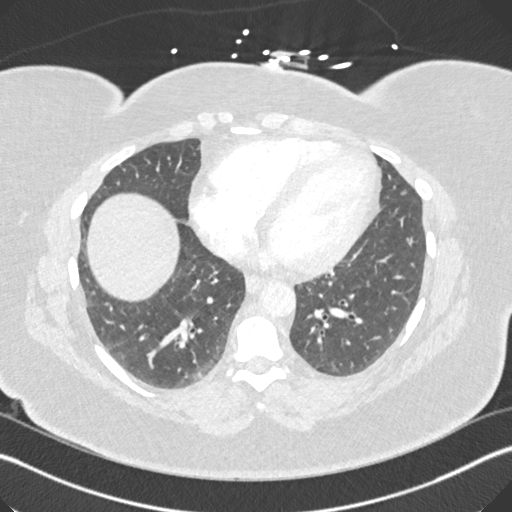
[im 157/391  mediastinal]
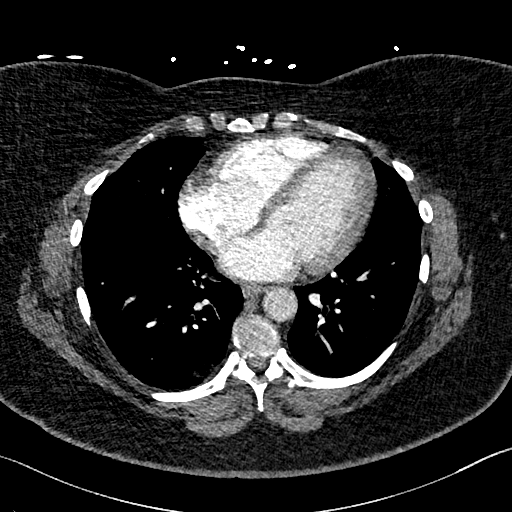
[im 176/391  lung]
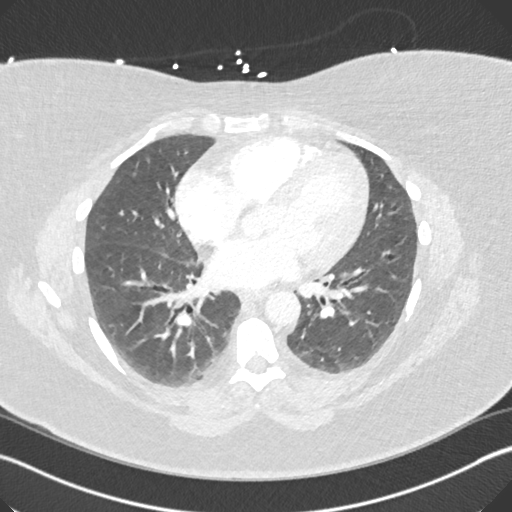
[im 215/391  mediastinal]
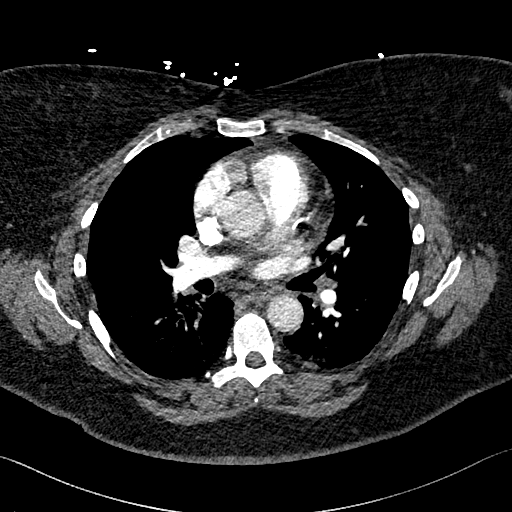
[im 235/391  lung]
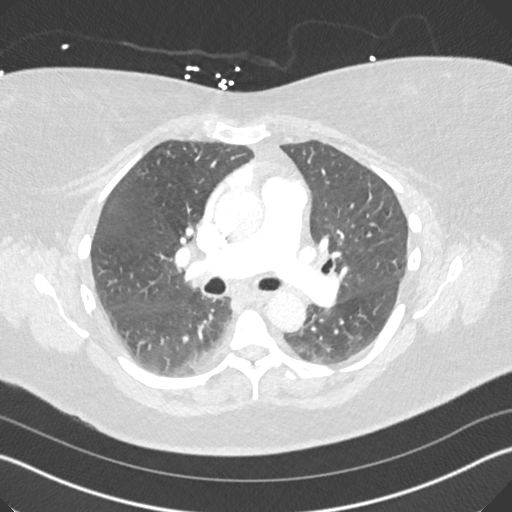
[im 254/391  mediastinal]
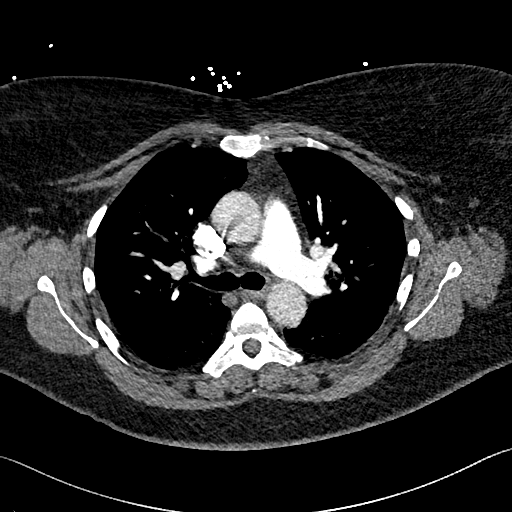
[im 274/391  lung]
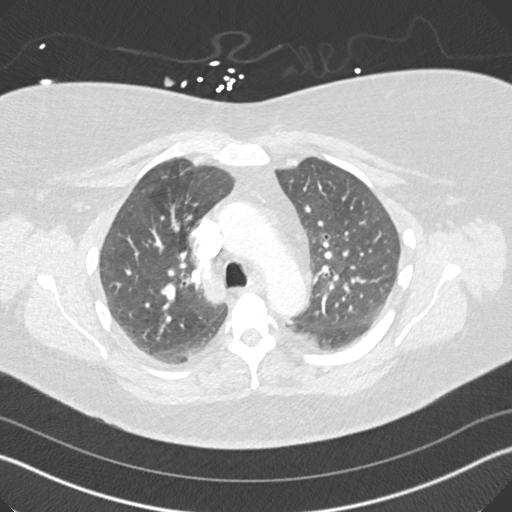
[im 293/391  mediastinal]
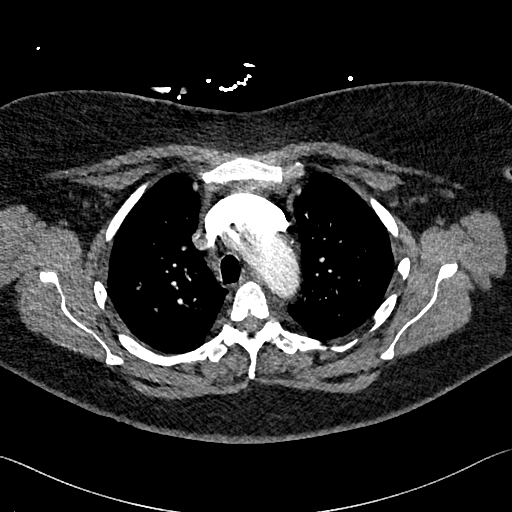
[im 313/391  lung]
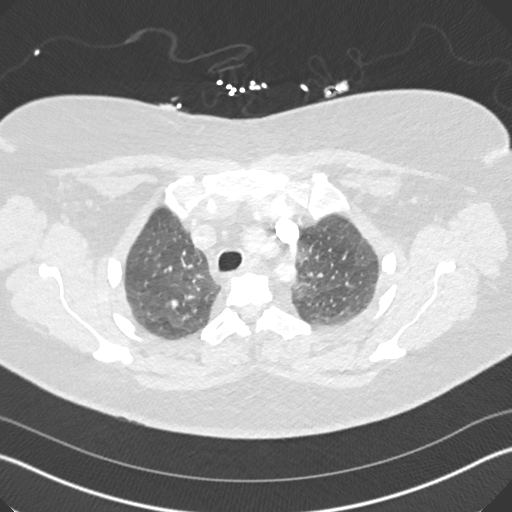
[im 332/391  mediastinal]
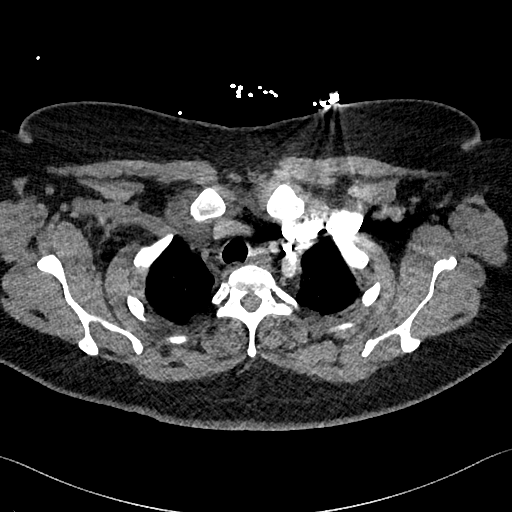
[im 352/391  lung]
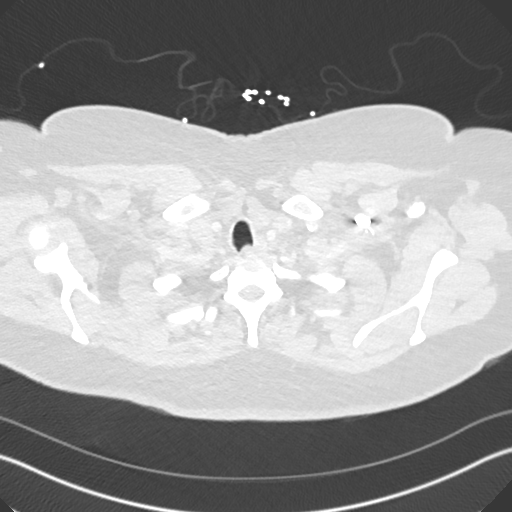
[im 371/391  mediastinal]
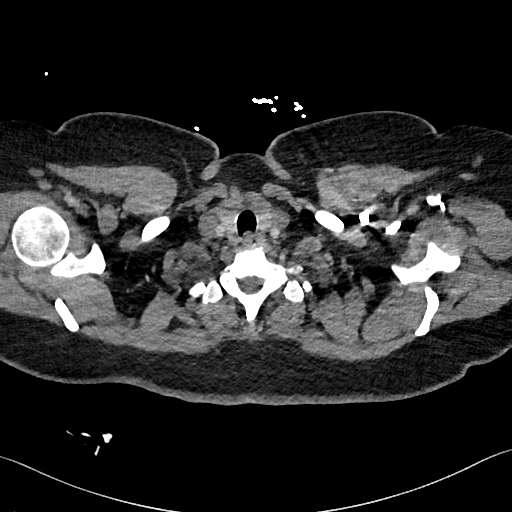

[Series 8: pe 2mm cor · coronal · 0.59mm/px · 1 of 151 slices shown]
[im 76/151  mediastinal]
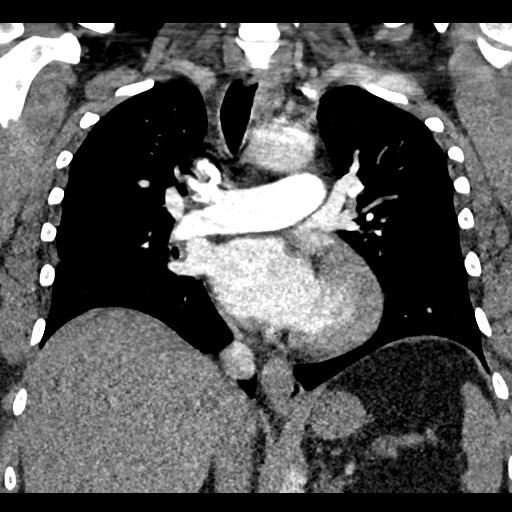

[19 of 36 positions shown; findings below may reference images not displayed]

FINDINGS: Cardiovascular: Good contrast bolus timing in the pulmonary arterial
tree.

No focal filling defect identified in the pulmonary arteries to
suggest acute pulmonary embolism.

Cardiac size within normal limits. Some left coronary calcified
atherosclerosis suspected on series 7, image 180. No pericardial
effusion. Little contrast in the aorta which appears negative.

Mediastinum/Nodes: Negative.  No lymphadenopathy.

Lungs/Pleura: Somewhat low lung volumes with bilateral pulmonary
ground-glass and more confluent dependent sub solid pulmonary
opacity most compatible with atelectasis. Mild mosaic attenuation in
the upper lobe suggesting some gas trapping. Major airways remain
patent. No pleural effusion or consolidation.

Upper Abdomen: Small calcified granuloma in the left anterior pathic
lobe. Absent gallbladder. Otherwise negative visible liver, spleen,
pancreas, adrenal glands and bowel in the upper abdomen.

Musculoskeletal: No acute osseous abnormality identified.

Review of the MIP images confirms the above findings.
IMPRESSION: 1. No evidence of acute pulmonary embolus.
2. Mild pulmonary atelectasis and gas trapping. No overt edema or
pleural effusion.
3. Suspected left coronary artery atherosclerosis.

## 2022-09-30 ENCOUNTER — Encounter: Payer: Self-pay | Admitting: Nurse Practitioner

## 2022-10-08 ENCOUNTER — Ambulatory Visit (HOSPITAL_BASED_OUTPATIENT_CLINIC_OR_DEPARTMENT_OTHER): Payer: Commercial Managed Care - PPO | Admitting: Cardiology

## 2022-10-14 ENCOUNTER — Encounter: Payer: Self-pay | Admitting: Nurse Practitioner

## 2022-10-15 ENCOUNTER — Other Ambulatory Visit (HOSPITAL_COMMUNITY): Payer: Self-pay

## 2022-10-24 MED ORDER — WEGOVY 0.25 MG/0.5ML ~~LOC~~ SOAJ: 0.2500 mg | SUBCUTANEOUS | 1 refills | Status: DC

## 2022-10-29 ENCOUNTER — Telehealth: Payer: Self-pay

## 2022-10-29 NOTE — Telephone Encounter (Signed)
Can we get a prior auth on Wegovy 0.25mg /0.36ml?

## 2022-10-30 ENCOUNTER — Other Ambulatory Visit (HOSPITAL_COMMUNITY): Payer: Self-pay

## 2022-10-30 ENCOUNTER — Telehealth: Payer: Self-pay

## 2022-10-30 NOTE — Telephone Encounter (Signed)
Pharmacy Patient Advocate Encounter   Received notification from [facility] [employer] [facility] that prior authorization for Wegovy 0.25mg /0.80ml is required/requested.   Insurance verification completed.   The patient is insured through [facility] .   Per test claim: PA required; PA submitted to [facility] via CoverMyMeds Key/confirmation #/EOC [id number] Status is pending

## 2022-11-03 ENCOUNTER — Other Ambulatory Visit (HOSPITAL_COMMUNITY): Payer: Self-pay

## 2022-11-03 NOTE — Telephone Encounter (Signed)
Pharmacy Patient Advocate Encounter  Received notification from [facility] that Prior Authorization for Wegovy 0.25mg /0.66ml has been APPROVED from 10/30/22 to 04/30/23   PA #/Case ID/Reference #: [id number]

## 2022-11-12 ENCOUNTER — Encounter (HOSPITAL_BASED_OUTPATIENT_CLINIC_OR_DEPARTMENT_OTHER): Payer: Self-pay | Admitting: Cardiology

## 2022-11-12 ENCOUNTER — Ambulatory Visit (HOSPITAL_BASED_OUTPATIENT_CLINIC_OR_DEPARTMENT_OTHER): Payer: PRIVATE HEALTH INSURANCE | Admitting: Cardiology

## 2022-11-12 ENCOUNTER — Ambulatory Visit (HOSPITAL_BASED_OUTPATIENT_CLINIC_OR_DEPARTMENT_OTHER): Payer: Commercial Managed Care - PPO | Admitting: Cardiology

## 2022-11-12 ENCOUNTER — Ambulatory Visit: Payer: Self-pay | Admitting: Nurse Practitioner

## 2022-11-12 VITALS — BP 128/74 | HR 51 | Ht 66.0 in | Wt 230.0 lb

## 2022-11-12 DIAGNOSIS — E7800 Pure hypercholesterolemia, unspecified: Secondary | ICD-10-CM

## 2022-11-12 DIAGNOSIS — I480 Paroxysmal atrial fibrillation: Secondary | ICD-10-CM

## 2022-11-12 DIAGNOSIS — G4733 Obstructive sleep apnea (adult) (pediatric): Secondary | ICD-10-CM | POA: Diagnosis not present

## 2022-11-12 DIAGNOSIS — I2510 Atherosclerotic heart disease of native coronary artery without angina pectoris: Secondary | ICD-10-CM

## 2022-11-12 NOTE — Patient Instructions (Signed)
Medication Instructions:  NO CHANGES  *If you need a refill on your cardiac medications before your next appointment, please call your pharmacy*   Lab Work: NONE If you have labs (blood work) drawn today and your tests are completely normal, you will receive your results only by: MyChart Message (if you have MyChart) OR A paper copy in the mail If you have any lab test that is abnormal or we need to change your treatment, we will call you to review the results.   Testing/Procedures: NONE   Follow-Up: At [facility], you and your health needs are our priority.  As part of our continuing mission to provide you with exceptional heart care, we have created designated Provider Care Teams.  These Care Teams include your primary Cardiologist (physician) and Advanced Practice Providers (APPs -  Physician Assistants and Nurse Practitioners) who all work together to provide you with the care you need, when you need it.  We recommend signing up for the patient portal called "MyChart".  Sign up information is provided on this After Visit Summary.  MyChart is used to connect with patients for Virtual Visits (Telemedicine).  Patients are able to view lab/test results, encounter notes, upcoming appointments, etc.  Non-urgent messages can be sent to your provider as well.   To learn more about what you can do with MyChart, go to [email or web address].    Your next appointment:   1 year(s)  Provider:   Jodelle Red, MD    Other Instructions NONE

## 2022-11-12 NOTE — Progress Notes (Signed)
Cardiology Office Note:  .   Date:  11/12/2022  ID:  Nicole Anderson, DOB August 17, 1965, MRN [id number] PCP: Gerre Scull, NP  [facility] Providers Cardiologist:  Jodelle Red, MD Sleep Medicine:  Armanda Magic, MD {  History of Present Illness: .   Nicole Anderson is a 57 y.o. female  with a hx of atrial fibrillation, chest discomfort who is seen for follow up. I met her in the hospital 09/10/20 when she presented with atrial fibrillation with RVR.   Cardiac history: Cardiac CTA 08/2020, which revealed a calcium score of 154, nonobstructive coronary disease in her proximal LAD. She had preeclampsia during both of her pregnancies.  Today: Left [facility] and went to [facility]. Had to stop Wegovy when [facility] stopped covering it, tried Contrave but without success, regained weight. Now that she is at [facility] she was able to restart [facility] two weeks ago.  Rare palpitations, brief. Using CPAP. Walks with her dog three times/day. Has some shortness of breath when she really exerts herself.  ROS: Denies chest pain, shortness of breath at rest. No PND, orthopnea, LE edema or unexpected weight gain. No syncope or palpitations. ROS otherwise negative except as noted.   Studies Reviewed: Marland Kitchen    EKG:  EKG Interpretation Date/Time:  Wednesday November 12 2022 16:17:29 EDT Ventricular Rate:  51 PR Interval:  144 QRS Duration:  88 QT Interval:  426 QTC Calculation: 392 R Axis:   -11  Text Interpretation: Sinus bradycardia Confirmed by Jodelle Red [phone]) on 11/12/2022 4:36:52 PM    Physical Exam:   VS:  BP 128/74 (BP Location: Left Arm, Patient Position: Sitting, Cuff Size: Large)   Pulse (!) 51   Ht 5\' 6"  (1.676 m)   Wt 230 lb (104.3 kg)   SpO2 95%   BMI 37.12 kg/m    Wt Readings from Last 3 Encounters:  11/12/22 230 lb (104.3 kg)  08/12/22 213 lb (96.6 kg)  11/29/21 206 lb 0.6 oz (93.5 kg)    GEN: Well nourished, well developed in no acute distress HEENT: Normal, moist  mucous membranes NECK: No JVD CARDIAC: regular rhythm, normal S1 and S2, no rubs or gallops. No murmur. VASCULAR: Radial and DP pulses 2+ bilaterally. No carotid bruits RESPIRATORY:  Clear to auscultation without rales, wheezing or rhonchi  ABDOMEN: Soft, non-tender, non-distended MUSCULOSKELETAL:  Ambulates independently SKIN: Warm and dry, no edema NEUROLOGIC:  Alert and oriented x 3. No focal neuro deficits noted. PSYCHIATRIC:  Normal affect    ASSESSMENT AND PLAN: .    Paroxysmal atrial fibrillation -intermittent but brief palpitations, none recently lasting longer than several minutes -chadsvasc=2 (female, nonobstructive CAD), not on anticoagulation as one of the points is for gender. Discussed that if this becomes more frequent or longer duration, I would re-evaluate for anticoagulation. Discussed that aspirin is not helpful for clots from afib -tolerating metoprolol well, she can take additional dose PRN for afib -discussed when to call office/present to ER.    Obstructive sleep apnea -now on CPAP, tolerating better   Nonobstructive CAD Hypercholesterolemia Obesity, BMI 45->33->37 Impaired glucose tolerance -continue aspirin and rosuvastatin -Wegovy now covered again at [facility], had great success with this before, just restarted -reviewed red flag warning signs that need immediate medical attention  CV risk counseling and prevention -recommend heart healthy/Mediterranean diet, with whole grains, fruits, vegetable, fish, lean meats, nuts, and olive oil. Limit salt. -recommend moderate walking, 3-5 times/week for 30-50 minutes each session. Aim for at least 150 minutes.week.  Goal should be pace of 3 miles/hours, or walking 1.5 miles in 30 minutes -recommend avoidance of tobacco products. Avoid excess alcohol.  Dispo: 1 year  Signed, Jodelle Red, MD   Jodelle Red, MD, PhD, [facility]  [facility]  [place]  [facility] [address] [place], [place] [address] [phone]

## 2022-11-21 MED ORDER — WEGOVY 0.5 MG/0.5ML ~~LOC~~ SOAJ: 0.5000 mg | SUBCUTANEOUS | 0 refills | Status: DC

## 2022-11-21 NOTE — Addendum Note (Signed)
Addended by: Rodman Pickle A on: 11/21/2022 04:56 PM   Modules accepted: Orders

## 2022-11-28 ENCOUNTER — Ambulatory Visit: Payer: PRIVATE HEALTH INSURANCE | Admitting: Nurse Practitioner

## 2022-11-28 ENCOUNTER — Encounter: Payer: Self-pay | Admitting: Nurse Practitioner

## 2022-11-28 VITALS — BP 122/80 | HR 59 | Temp 97.0°F | Ht 66.0 in | Wt 234.0 lb

## 2022-11-28 DIAGNOSIS — Z23 Encounter for immunization: Secondary | ICD-10-CM | POA: Diagnosis not present

## 2022-11-28 DIAGNOSIS — R58 Hemorrhage, not elsewhere classified: Secondary | ICD-10-CM

## 2022-11-28 DIAGNOSIS — E669 Obesity, unspecified: Secondary | ICD-10-CM | POA: Diagnosis not present

## 2022-11-28 NOTE — Assessment & Plan Note (Signed)
She has started the next dose Wegovy 0.5mg  and reports no side effects while making dietary modifications. We will increase the Wegovy dose as tolerated and according to protocol, continue dietary modifications, and follow up in 3 months.

## 2022-11-28 NOTE — Patient Instructions (Signed)
It was great to see you!  Let me know when you need a refill on your [facility] and I will send in the next dose.   Let's follow-up in 3 months, sooner if you have concerns.  If a referral was placed today, you will be contacted for an appointment. Please note that routine referrals can sometimes take up to 3-4 weeks to process. Please call our office if you haven't heard anything after this time frame.  Take care,  Rodman Pickle, NP

## 2022-11-28 NOTE — Progress Notes (Signed)
Established Patient Office Visit  Subjective   Patient ID: Nicole Anderson, female    DOB: September 20, 1965  Age: 57 y.o. MRN: [id number]  Chief Complaint  Patient presents with   Weight Management     Follow up and shingles vaccine    HPI  Discussed the use of AI scribe software for clinical note transcription with the patient, who gave verbal consent to proceed.  History of Present Illness   The patient, with a history of heart disease and obesity, presents with recent weight gain. She has restarted Wegovy 0.5mg , a medication for weight loss, and is hopeful for positive results. She has not noticed any side effects from the medication. She has a plan to manage her weight, which includes monitoring her snacking habits and switching to Stevia for her coffee.  She is hoping the increase in doses will help with food noise again.   In addition, the patient fell recently due to wet conditions outside, resulting in a large bruise on her buttock. She landed on the edge of a step, causing significant bruising, but no other injuries. She has some soreness when sitting down, but the bruise is soft to touch and not causing significant discomfort.        ROS See pertinent positives and negatives per HPI.    Objective:     BP 122/80 (BP Location: Left Arm)   Pulse (!) 59   Temp (!) 97 F (36.1 C)   Ht 5\' 6"  (1.676 m)   Wt 234 lb (106.1 kg)   SpO2 99%   BMI 37.77 kg/m  BP Readings from Last 3 Encounters:  11/28/22 122/80  11/12/22 128/74  08/12/22 128/82   Wt Readings from Last 3 Encounters:  11/28/22 234 lb (106.1 kg)  11/12/22 230 lb (104.3 kg)  08/12/22 213 lb (96.6 kg)      Physical Exam Vitals and nursing note reviewed.  Constitutional:      General: She is not in acute distress.    Appearance: Normal appearance.  HENT:     Head: Normocephalic.  Eyes:     Conjunctiva/sclera: Conjunctivae normal.  Cardiovascular:     Rate and Rhythm: Normal rate and regular rhythm.      Pulses: Normal pulses.     Heart sounds: Normal heart sounds.  Pulmonary:     Effort: Pulmonary effort is normal.     Breath sounds: Normal breath sounds.  Musculoskeletal:     Cervical back: Normal range of motion.  Skin:    General: Skin is warm.  Neurological:     General: No focal deficit present.     Mental Status: She is alert and oriented to person, place, and time.  Psychiatric:        Mood and Affect: Mood normal.        Behavior: Behavior normal.        Thought Content: Thought content normal.        Judgment: Judgment normal.    The 10-year ASCVD risk score (Arnett DK, et al., 2019) is: 1.4%    Assessment & Plan:   Problem List Items Addressed This Visit       Other   Obesity (BMI 30-39.9)    She has started the next dose Wegovy 0.5mg  and reports no side effects while making dietary modifications. We will increase the Wegovy dose as tolerated and according to protocol, continue dietary modifications, and follow up in 3 months.      Other Visit Diagnoses  Ecchymosis    -  Primary   She recently fell, resulting in a large bruise on her buttock but shows no signs of hematoma or complications. Can use warm compresses prn   Immunization due       Second shingrix vaccine given today      Return in about 3 months (around 02/28/2023) for weight management .    Gerre Scull, NP

## 2022-11-28 NOTE — Addendum Note (Signed)
Addended by: Malena Peer Y on: 11/28/2022 03:07 PM   Modules accepted: Orders

## 2022-12-17 ENCOUNTER — Encounter: Payer: Self-pay | Admitting: Nurse Practitioner

## 2022-12-17 MED ORDER — WEGOVY 1 MG/0.5ML ~~LOC~~ SOAJ: 1.0000 mg | SUBCUTANEOUS | 0 refills | Status: DC

## 2023-01-13 MED ORDER — WEGOVY 1.7 MG/0.75ML ~~LOC~~ SOAJ: 1.7000 mg | SUBCUTANEOUS | 1 refills | Status: DC

## 2023-01-13 NOTE — Addendum Note (Signed)
Addended by: Rodman Pickle A on: 01/13/2023 03:23 PM   Modules accepted: Orders

## 2023-01-22 ENCOUNTER — Encounter: Payer: Self-pay | Admitting: Nurse Practitioner

## 2023-01-22 ENCOUNTER — Other Ambulatory Visit: Payer: Self-pay | Admitting: Family

## 2023-02-02 ENCOUNTER — Other Ambulatory Visit (HOSPITAL_BASED_OUTPATIENT_CLINIC_OR_DEPARTMENT_OTHER): Payer: Self-pay | Admitting: Cardiology

## 2023-02-10 ENCOUNTER — Telehealth (HOSPITAL_BASED_OUTPATIENT_CLINIC_OR_DEPARTMENT_OTHER): Payer: Self-pay | Admitting: *Deleted

## 2023-02-10 MED ORDER — METOPROLOL TARTRATE 25 MG PO TABS: 12.5000 mg | ORAL_TABLET | Freq: Two times a day (BID) | ORAL | 0 refills | Status: DC

## 2023-02-10 NOTE — Telephone Encounter (Signed)
 Received call from Loni Gower D with [facility] [phone]) Patient had her Metoprolol  transferred and was filled incorrectly for Metoprolol  50 mg 1/2 tablet twice a day  Has been taking that dose since September  Spoke with patient, feeling ok but does have some fatigue. Unsure if coming from holidays Blood pressures running 120's 70-80 HR 60's   Discussed with Dr Lonni and will have patient decrease to Metoprolol  25 mg 1/2 tablet twice a day, monitor and if any issues let us  know  Advised Varsha Pharm D and patient

## 2023-02-17 ENCOUNTER — Encounter (HOSPITAL_BASED_OUTPATIENT_CLINIC_OR_DEPARTMENT_OTHER): Payer: Self-pay

## 2023-02-17 NOTE — Telephone Encounter (Signed)
If she has an episode of atrial fibrillation lasting >30 minutes would recommend she let us know. If she has an episode of atrial fibrillation lasting >10 minutes she should take an extra half tablet of Metoprolol as she did. Most common triggers: dehydration, caffeine, stress, not wearing CPAP regularly. Could certainly have her stop by for blood work (TSH, BMP, CBC, magnesium) to ensure no abnormality that triggered the episode. If episodes start occurring more frequently, would recommend office visit and/or 14 day ZIO for further evaluation.   Alver Sorrow, NP

## 2023-02-18 ENCOUNTER — Other Ambulatory Visit: Payer: Self-pay | Admitting: Family

## 2023-02-18 MED ORDER — WEGOVY 1.7 MG/0.75ML ~~LOC~~ SOAJ: 1.7000 mg | SUBCUTANEOUS | 1 refills | Status: DC

## 2023-03-02 ENCOUNTER — Ambulatory Visit: Payer: PRIVATE HEALTH INSURANCE | Admitting: Nurse Practitioner

## 2023-03-13 ENCOUNTER — Other Ambulatory Visit: Payer: Self-pay

## 2023-03-13 DIAGNOSIS — E785 Hyperlipidemia, unspecified: Secondary | ICD-10-CM

## 2023-03-13 MED ORDER — ROSUVASTATIN CALCIUM 20 MG PO TABS: 20.0000 mg | ORAL_TABLET | Freq: Every day | ORAL | 0 refills | Status: DC

## 2023-03-13 NOTE — Telephone Encounter (Signed)
Requesting: Rosuvastatin 20 mg Last Visit: 11/28/2022 Next Visit: 04/03/2023 Last Refill: 09/15/2022  Please Advise

## 2023-03-20 ENCOUNTER — Encounter: Payer: Self-pay | Admitting: Nurse Practitioner

## 2023-03-20 MED ORDER — WEGOVY 1.7 MG/0.75ML ~~LOC~~ SOAJ
1.7000 mg | SUBCUTANEOUS | 1 refills | Status: DC
Start: 2023-03-20 — End: 2023-04-20

## 2023-04-03 ENCOUNTER — Ambulatory Visit: Payer: PRIVATE HEALTH INSURANCE | Admitting: Nurse Practitioner

## 2023-04-20 ENCOUNTER — Ambulatory Visit: Payer: PRIVATE HEALTH INSURANCE | Admitting: Nurse Practitioner

## 2023-04-20 ENCOUNTER — Encounter: Payer: Self-pay | Admitting: Nurse Practitioner

## 2023-04-20 VITALS — BP 118/84 | HR 57 | Temp 97.0°F | Ht 66.0 in | Wt 233.2 lb

## 2023-04-20 DIAGNOSIS — M25562 Pain in left knee: Secondary | ICD-10-CM | POA: Diagnosis not present

## 2023-04-20 DIAGNOSIS — M79644 Pain in right finger(s): Secondary | ICD-10-CM

## 2023-04-20 DIAGNOSIS — E669 Obesity, unspecified: Secondary | ICD-10-CM

## 2023-04-20 DIAGNOSIS — G8929 Other chronic pain: Secondary | ICD-10-CM | POA: Insufficient documentation

## 2023-04-20 MED ORDER — WEGOVY 1.7 MG/0.75ML ~~LOC~~ SOAJ
1.7000 mg | SUBCUTANEOUS | 2 refills | Status: DC
Start: 2023-04-20 — End: 2023-06-03

## 2023-04-20 NOTE — Assessment & Plan Note (Signed)
 She is experiencing weight gain on Wegovy and plans to consult a nutritionist for dietary guidance. Refer to a nutritionist for dietary counseling. Track caloric intake, aiming for 1500-1700 calories per day. Refill Wegovy 1.7mg  injection weekly prescription.

## 2023-04-20 NOTE — Patient Instructions (Signed)
 It was great to see you!  I have placed a referral to a nutritionist and refilled your wegovy   Let's follow-up in 4 months, sooner if you have concerns.  If a referral was placed today, you will be contacted for an appointment. Please note that routine referrals can sometimes take up to 3-4 weeks to process. Please call our office if you haven't heard anything after this time frame.  Take care,  Rodman Pickle, NP

## 2023-04-20 NOTE — Assessment & Plan Note (Signed)
 She completed physical therapy with improvement and is advised to rest to allow healing. Avoid strenuous exercise.

## 2023-04-20 NOTE — Progress Notes (Signed)
 Established Patient Office Visit  Subjective   Patient ID: Nicole Anderson, female    DOB: [birth year]  Age: 58 y.o. MRN: [id number]  Chief Complaint  Patient presents with   Weight Management    Follow up    HPI  Discussed the use of AI scribe software for clinical note transcription with the patient, who gave verbal consent to proceed.  History of Present Illness   The patient, with a history of weight issues managed with Wegovy, presents with concerns about recent weight gain. Despite being on Wegovy, the patient reports gaining some weight back after initially losing eighty pounds. The patient is unsure if she is eating enough or correctly, and is considering seeking help from a nutritionist or a program. The patient's typical diet includes protein yogurts, protein shakes, chicken, fruits, and vegetables, but she is unsure if she is drinking enough water or consuming the right amount of calories.  In addition to weight concerns, the patient reports two falls in the past year. The first fall resulted in a meniscus injury, which has been managed with physical therapy. The second fall, which occurred while walking her dog, resulted in a tear in her right thumb. The patient is scheduled to see a hand surgeon for this injury. These injuries have limited the patient's ability to walk and exercise, which she believes may be contributing to her weight gain.        ROS See pertinent positives and negatives per HPI.    Objective:     BP 118/84   Pulse (!) 57   Temp (!) 97 F (36.1 C)   Ht 5\' 6"  (1.676 m)   Wt 233 lb 3.2 oz (105.8 kg)   SpO2 96%   BMI 37.64 kg/m  BP Readings from Last 3 Encounters:  04/20/23 118/84  11/28/22 122/80  11/12/22 128/74   Wt Readings from Last 3 Encounters:  04/20/23 233 lb 3.2 oz (105.8 kg)  11/28/22 234 lb (106.1 kg)  11/12/22 230 lb (104.3 kg)      Physical Exam Vitals and nursing note reviewed.  Constitutional:      General: She is not  in acute distress.    Appearance: Normal appearance.  HENT:     Head: Normocephalic.  Eyes:     Conjunctiva/sclera: Conjunctivae normal.  Cardiovascular:     Rate and Rhythm: Normal rate and regular rhythm.     Pulses: Normal pulses.     Heart sounds: Normal heart sounds.  Pulmonary:     Effort: Pulmonary effort is normal.     Breath sounds: Normal breath sounds.  Musculoskeletal:     Cervical back: Normal range of motion.  Skin:    General: Skin is warm.  Neurological:     General: No focal deficit present.     Mental Status: She is alert and oriented to person, place, and time.  Psychiatric:        Mood and Affect: Mood normal.        Behavior: Behavior normal.        Thought Content: Thought content normal.        Judgment: Judgment normal.    The 10-year ASCVD risk score (Arnett DK, et al., 2019) is: 1.5%    Assessment & Plan:   Problem List Items Addressed This Visit       Other   Obesity (BMI 30-39.9)   She is experiencing weight gain on Wegovy and plans to consult a nutritionist for dietary guidance.  Refer to a nutritionist for dietary counseling. Track caloric intake, aiming for 1500-1700 calories per day. Refill Wegovy 1.7mg  injection weekly prescription.       Relevant Medications   Semaglutide-Weight Management (WEGOVY) 1.7 MG/0.75ML SOAJ   Other Relevant Orders   Amb ref to Medical Nutrition Therapy-[facility]   Chronic pain of left knee   She completed physical therapy with improvement and is advised to rest to allow healing. Avoid strenuous exercise.      Relevant Medications   diclofenac (VOLTAREN) 75 MG EC tablet   Other Visit Diagnoses       Pain of right thumb    -  Primary   She is scheduled for a hand surgeon evaluation and potential surgery, pending MRI results.       Return in about 4 months (around 08/20/2023) for CPE.    Gerre Scull, NP

## 2023-05-05 ENCOUNTER — Telehealth: Payer: Self-pay

## 2023-05-05 NOTE — Telephone Encounter (Signed)
 Spoke with pt, she is very happy where she gets her mammograms done and wants to stay with them.

## 2023-05-05 NOTE — Telephone Encounter (Signed)
 I called patient and left message for patient to return call.  We received a letter that patient is due for her yearly mammogram screening from [facility]. Lauren wanted to know if patient want to schedule mammogram at our mammogram bus?

## 2023-05-06 NOTE — Telephone Encounter (Signed)
 I called and spoke with patient and she does not need an order and will call back if she does.

## 2023-05-11 NOTE — Telephone Encounter (Signed)
 Nicole Anderson aware of below message.

## 2023-05-14 ENCOUNTER — Other Ambulatory Visit (HOSPITAL_BASED_OUTPATIENT_CLINIC_OR_DEPARTMENT_OTHER): Payer: Self-pay | Admitting: Cardiology

## 2023-06-03 ENCOUNTER — Telehealth: Payer: Self-pay

## 2023-06-03 ENCOUNTER — Encounter: Payer: Self-pay | Admitting: Nurse Practitioner

## 2023-06-03 ENCOUNTER — Other Ambulatory Visit (HOSPITAL_COMMUNITY): Payer: Self-pay

## 2023-06-03 MED ORDER — WEGOVY 2.4 MG/0.75ML ~~LOC~~ SOAJ: 2.4000 mg | SUBCUTANEOUS | 1 refills | Status: DC

## 2023-06-03 NOTE — Telephone Encounter (Signed)
 Pharmacy Patient Advocate Encounter   Received notification from Pt Calls Messages that prior authorization for Wegovy  2.4 is required/requested.   Insurance verification completed.   The patient is insured through [facility] .   Per test claim: PA required; PA submitted to above mentioned insurance via CoverMyMeds Key/confirmation #/EOC [id number] Status is pending

## 2023-06-03 NOTE — Telephone Encounter (Signed)
 Pharmacy Patient Advocate Encounter  Received notification from [facility] [employer] that Prior Authorization for Wegovy  2.4 has been APPROVED from 06/03/23 to 11/30/23. Ran test claim, Copay is $24.99 for a 24 day supply. This test claim was processed through [facility]- copay amounts may vary at other pharmacies due to pharmacy/plan contracts, or as the patient moves through the different stages of their insurance plan.   PA #/Case ID/Reference #[id number]

## 2023-07-28 ENCOUNTER — Other Ambulatory Visit: Payer: Self-pay | Admitting: Nurse Practitioner

## 2023-07-28 DIAGNOSIS — E785 Hyperlipidemia, unspecified: Secondary | ICD-10-CM

## 2023-08-10 ENCOUNTER — Encounter (HOSPITAL_BASED_OUTPATIENT_CLINIC_OR_DEPARTMENT_OTHER): Payer: Self-pay

## 2023-08-10 DIAGNOSIS — R002 Palpitations: Secondary | ICD-10-CM

## 2023-08-14 LAB — HM MAMMOGRAPHY

## 2023-08-18 ENCOUNTER — Encounter: Payer: Self-pay | Admitting: Nurse Practitioner

## 2023-08-21 ENCOUNTER — Encounter: Payer: Self-pay | Admitting: Nurse Practitioner

## 2023-08-21 ENCOUNTER — Ambulatory Visit (INDEPENDENT_AMBULATORY_CARE_PROVIDER_SITE_OTHER): Payer: PRIVATE HEALTH INSURANCE | Admitting: Nurse Practitioner

## 2023-08-21 ENCOUNTER — Ambulatory Visit: Payer: Self-pay | Admitting: Nurse Practitioner

## 2023-08-21 VITALS — BP 122/76 | HR 54 | Temp 96.8°F | Ht 66.0 in | Wt 213.2 lb

## 2023-08-21 DIAGNOSIS — R7303 Prediabetes: Secondary | ICD-10-CM

## 2023-08-21 DIAGNOSIS — Z6834 Body mass index (BMI) 34.0-34.9, adult: Secondary | ICD-10-CM

## 2023-08-21 DIAGNOSIS — E7800 Pure hypercholesterolemia, unspecified: Secondary | ICD-10-CM

## 2023-08-21 DIAGNOSIS — E041 Nontoxic single thyroid nodule: Secondary | ICD-10-CM

## 2023-08-21 DIAGNOSIS — Z0000 Encounter for general adult medical examination without abnormal findings: Secondary | ICD-10-CM

## 2023-08-21 DIAGNOSIS — H15002 Unspecified scleritis, left eye: Secondary | ICD-10-CM

## 2023-08-21 DIAGNOSIS — I480 Paroxysmal atrial fibrillation: Secondary | ICD-10-CM | POA: Diagnosis not present

## 2023-08-21 DIAGNOSIS — E669 Obesity, unspecified: Secondary | ICD-10-CM | POA: Diagnosis not present

## 2023-08-21 DIAGNOSIS — G4733 Obstructive sleep apnea (adult) (pediatric): Secondary | ICD-10-CM | POA: Diagnosis not present

## 2023-08-21 DIAGNOSIS — G8929 Other chronic pain: Secondary | ICD-10-CM

## 2023-08-21 DIAGNOSIS — M25562 Pain in left knee: Secondary | ICD-10-CM

## 2023-08-21 LAB — COMPREHENSIVE METABOLIC PANEL WITH GFR
ALT: 14 U/L (ref 0–35)
AST: 13 U/L (ref 0–37)
Albumin: 4.3 g/dL (ref 3.5–5.2)
Alkaline Phosphatase: 56 U/L (ref 39–117)
BUN: 19 mg/dL (ref 6–23)
CO2: 28 meq/L (ref 19–32)
Calcium: 9.7 mg/dL (ref 8.4–10.5)
Chloride: 105 meq/L (ref 96–112)
Creatinine, Ser: 0.64 mg/dL (ref 0.40–1.20)
GFR: 97.27 mL/min (ref >60.00)
Glucose, Bld: 82 mg/dL (ref 70–99)
Potassium: 4 meq/L (ref 3.5–5.1)
Sodium: 140 meq/L (ref 135–145)
Total Bilirubin: 0.6 mg/dL (ref 0.2–1.2)
Total Protein: 7.1 g/dL (ref 6.0–8.3)

## 2023-08-21 LAB — LIPID PANEL
Cholesterol: 101 mg/dL (ref 0–200)
HDL: 43.1 mg/dL (ref >39.00)
LDL Cholesterol: 45 mg/dL (ref 0–99)
NonHDL: 58.14
Total CHOL/HDL Ratio: 2
Triglycerides: 64 mg/dL (ref 0.0–149.0)
VLDL: 12.8 mg/dL (ref 0.0–40.0)

## 2023-08-21 LAB — CBC WITH DIFFERENTIAL/PLATELET
Basophils Absolute: 0.1 K/uL (ref 0.0–0.1)
Basophils Relative: 0.9 % (ref 0.0–3.0)
Eosinophils Absolute: 0.1 K/uL (ref 0.0–0.7)
Eosinophils Relative: 1.3 % (ref 0.0–5.0)
HCT: 39.4 % (ref 36.0–46.0)
Hemoglobin: 13.4 g/dL (ref 12.0–15.0)
Lymphocytes Relative: 21.4 % (ref 12.0–46.0)
Lymphs Abs: 1.6 K/uL (ref 0.7–4.0)
MCHC: 34 g/dL (ref 30.0–36.0)
MCV: 89.3 fl (ref 78.0–100.0)
Monocytes Absolute: 0.5 K/uL (ref 0.1–1.0)
Monocytes Relative: 6.9 % (ref 3.0–12.0)
Neutro Abs: 5.3 K/uL (ref 1.4–7.7)
Neutrophils Relative %: 69.5 % (ref 43.0–77.0)
Platelets: 277 K/uL (ref 150.0–400.0)
RBC: 4.41 Mil/uL (ref 3.87–5.11)
RDW: 13.3 % (ref 11.5–15.5)
WBC: 7.7 K/uL (ref 4.0–10.5)

## 2023-08-21 LAB — C-REACTIVE PROTEIN: CRP: <1 mg/dL (ref 0.5–20.0)

## 2023-08-21 LAB — HEMOGLOBIN A1C: Hgb A1c MFr Bld: 5.6 % (ref 4.6–6.5)

## 2023-08-21 LAB — TSH: TSH: 1.27 u[IU]/mL (ref 0.35–5.50)

## 2023-08-21 LAB — SEDIMENTATION RATE: Sed Rate: 12 mm/h (ref 0–30)

## 2023-08-21 LAB — VITAMIN D 25 HYDROXY (VIT D DEFICIENCY, FRACTURES): VITD: 44.07 ng/mL (ref 30.00–100.00)

## 2023-08-21 MED ORDER — NITROGLYCERIN 0.4 MG SL SUBL: 0.4000 mg | SUBLINGUAL_TABLET | SUBLINGUAL | 0 refills | Status: AC | PRN

## 2023-08-21 NOTE — Patient Instructions (Signed)
 It was great to see you!  We are checking your labs today and will let you know the results via mychart/phone.   Let me know if you need any refills  Reach out to cardiology - I will message them as well   Let's follow-up in 1 year, sooner if you have concerns.  If a referral was placed today, you will be contacted for an appointment. Please note that routine referrals can sometimes take up to 3-4 weeks to process. Please call our office if you haven't heard anything after this time frame.  Take care,  Tinnie Harada, NP

## 2023-08-21 NOTE — Assessment & Plan Note (Signed)
She was recently diagnosed with mild sleep apnea is using his CPAP every night.  Continue using this to help control symptoms along with A-fib.

## 2023-08-21 NOTE — Assessment & Plan Note (Signed)
 She has a history of paroxysmal A-fib. 2 weeks ago, she had a 2.5 hour run of a-fib. This is longer than it has been in the past. It ended up breaking on it's own. She reached out to cardiology and is waiting for a response. Continue aspirin  81 mg daily and metoprolol  12.5 mg twice a day.  Continue collaboration recommendations from cardiology.

## 2023-08-21 NOTE — Assessment & Plan Note (Signed)
 History of elevated cholesterol that is well controlled on rosuvastatin  20 mg daily.  We will check CMP, CBC, lipid panel today.

## 2023-08-21 NOTE — Assessment & Plan Note (Signed)
 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 She is following with ophthalmology. Check ANA, ESR, CRP today.

## 2023-08-21 NOTE — Assessment & Plan Note (Signed)
 She completed physical therapy however pain worsened. She has surgery planned for August.

## 2023-08-21 NOTE — Progress Notes (Signed)
 BP 122/76 (BP Location: Left Arm, Patient Position: Sitting, Cuff Size: Normal)   Pulse (!) 54   Temp (!) 96.8 F (36 C)   Ht 5' 6 (1.676 m)   Wt 213 lb 3.2 oz (96.7 kg)   SpO2 98%   BMI 34.41 kg/m    Subjective:    Patient ID: Nicole Anderson, female    DOB: 02-15-1965, 58 y.o.   MRN: [id number]  CC: Chief Complaint  Patient presents with   Annual Exam    With fasting labs, no concerns    HPI: Nicole Anderson is a 58 y.o. female presenting on 08/21/2023 for comprehensive medical examination. Current medical complaints include:none  She currently lives with: husband Menopausal Symptoms: no  Depression and Anxiety Screen done today and results listed below:     08/21/2023    8:35 AM 08/12/2022    9:21 AM 09/19/2021   11:31 AM  Depression screen PHQ 2/9  Decreased Interest 0 0 0  Down, Depressed, Hopeless 0 0 0  PHQ - 2 Score 0 0 0  Altered sleeping 0 0 0  Tired, decreased energy 0 0 0  Change in appetite 0 0 0  Feeling bad or failure about yourself  0 0 0  Trouble concentrating 0 0 0  Moving slowly or fidgety/restless 0 0 0  Suicidal thoughts 0 0 0  PHQ-9 Score 0 0 0  Difficult doing work/chores Not difficult at all Not difficult at all       08/21/2023    8:36 AM 08/12/2022    9:21 AM 09/19/2021   11:31 AM  GAD 7 : Generalized Anxiety Score  Nervous, Anxious, on Edge 0 0 0  Control/stop worrying 0 0 0  Worry too much - different things 0 0 0  Trouble relaxing 0 0 0  Restless 0 0 0  Easily annoyed or irritable 0 0 0  Afraid - awful might happen 0 0 0  Total GAD 7 Score 0 0 0  Anxiety Difficulty Not difficult at all Not difficult at all     The patient does not have a history of falls. I did not complete a risk assessment for falls. A plan of care for falls was not documented.   Past Medical History:  Past Medical History:  Diagnosis Date   Endometriosis    Family history of adverse reaction to anesthesia    mom with complications   Hyperlipidemia     Paroxysmal atrial fibrillation (HCC)    Prediabetes    Sleep apnea    mild with AHI 9.9/hr on auto CPAP    Surgical History:  Past Surgical History:  Procedure Laterality Date   ACHILLES TENDON SURGERY Bilateral    APPENDECTOMY     CHOLECYSTECTOMY     EXPLORATORY LAPAROTOMY     REPAIR EXTENSOR TENDON WITH METATARSAL OSTEOTOMY AND OPEN REDUCTION IN Right 03/29/2020   Procedure: OPEN TREATMENT OF RIGHT FIFTH METATARSAL WITH REPAIR OF NONUNION, PERONEUS BREVIS TENOLYSIS;  Surgeon: Elsa Lonni SAUNDERS, MD;  Location: [facility];  Service: Orthopedics;  Laterality: Right;  LENGTH OF SURGERY: 1.5 HOURS   ROTATOR CUFF REPAIR Left     Medications:  Current Outpatient Medications on File Prior to Visit  Medication Sig   diclofenac (VOLTAREN) 75 MG EC tablet Take 75 mg by mouth 2 (two) times daily. (Patient taking differently: Take 75 mg by mouth as needed.)   metoprolol  tartrate (LOPRESSOR ) 25 MG tablet Take A HALF tablet (12.5mg   total) by mouth 2 (two) times daily.   rosuvastatin  (CRESTOR ) 20 MG tablet Take 1 tablet (20 mg total) by mouth daily.   ZEPBOUND 5 MG/0.5ML Pen Inject 5 mg into the skin once a week.   aspirin  EC 81 MG tablet Take 1 tablet (81 mg total) by mouth daily. Swallow whole. (Patient not taking: Reported on 08/21/2023)   No current facility-administered medications on file prior to visit.    Allergies:  Allergies  Allergen Reactions   Ketorolac Tromethamine Rash   Macrobid [Nitrofurantoin] Rash   Dilaudid [Hydromorphone] Itching    Social History:  Social History   Socioeconomic History   Marital status: Married    Spouse name: Not on file   Number of children: Not on file   Years of education: Not on file   Highest education level: Not on file  Occupational History   Not on file  Tobacco Use   Smoking status: Former   Smokeless tobacco: Never  Substance and Sexual Activity   Alcohol use: Never   Drug use: Never   Sexual activity: Not on  file  Other Topics Concern   Not on file  Social History Narrative   Not on file   Social Drivers of Health   Financial Resource Strain: Not on file  Food Insecurity: Not on file  Transportation Needs: Not on file  Physical Activity: Not on file  Stress: Not on file  Social Connections: Not on file  Intimate Partner Violence: Not on file   Social History   Tobacco Use  Smoking Status Former  Smokeless Tobacco Never   Social History   Substance and Sexual Activity  Alcohol Use Never    Family History:  Family History  Problem Relation Age of Onset   Stroke Mother    Heart attack Father    Hypothyroidism Maternal Grandmother    Heart block Maternal Grandmother    Atrial fibrillation Maternal Grandfather    Cancer Paternal Grandmother        breast and colon    Past medical history, surgical history, medications, allergies, family history and social history reviewed with patient today and changes made to appropriate areas of the chart.   Review of Systems  Constitutional: Negative.   HENT: Negative.    Eyes: Negative.   Respiratory: Negative.    Cardiovascular: Negative.   Gastrointestinal: Negative.   Genitourinary: Negative.   Musculoskeletal:  Positive for joint pain (left knee).  Skin: Negative.   Neurological: Negative.   Psychiatric/Behavioral: Negative.     All other ROS negative except what is listed above and in the HPI.      Objective:    BP 122/76 (BP Location: Left Arm, Patient Position: Sitting, Cuff Size: Normal)   Pulse (!) 54   Temp (!) 96.8 F (36 C)   Ht 5' 6 (1.676 m)   Wt 213 lb 3.2 oz (96.7 kg)   SpO2 98%   BMI 34.41 kg/m   Wt Readings from Last 3 Encounters:  08/21/23 213 lb 3.2 oz (96.7 kg)  04/20/23 233 lb 3.2 oz (105.8 kg)  11/28/22 234 lb (106.1 kg)    Physical Exam Vitals and nursing note reviewed.  Constitutional:      General: She is not in acute distress.    Appearance: Normal appearance.  HENT:     Head:  Normocephalic and atraumatic.     Right Ear: Tympanic membrane, ear canal and external ear normal.     Left Ear: Tympanic membrane,  ear canal and external ear normal.     Mouth/Throat:     Mouth: Mucous membranes are moist.     Pharynx: No posterior oropharyngeal erythema.  Eyes:     Conjunctiva/sclera: Conjunctivae normal.  Cardiovascular:     Rate and Rhythm: Normal rate and regular rhythm.     Pulses: Normal pulses.     Heart sounds: Normal heart sounds.  Pulmonary:     Effort: Pulmonary effort is normal.     Breath sounds: Normal breath sounds.  Abdominal:     Palpations: Abdomen is soft.     Tenderness: There is no abdominal tenderness.  Musculoskeletal:        General: Tenderness (left knee) present. Normal range of motion.     Cervical back: Normal range of motion and neck supple.     Right lower leg: No edema.     Left lower leg: No edema.  Lymphadenopathy:     Cervical: No cervical adenopathy.  Skin:    General: Skin is warm and dry.  Neurological:     General: No focal deficit present.     Mental Status: She is alert and oriented to person, place, and time.     Cranial Nerves: No cranial nerve deficit.     Coordination: Coordination normal.     Gait: Gait normal.  Psychiatric:        Mood and Affect: Mood normal.        Behavior: Behavior normal.        Thought Content: Thought content normal.        Judgment: Judgment normal.     Results for orders placed or performed in visit on 08/18/23  HM MAMMOGRAPHY   Collection Time: 08/14/23 11:30 AM  Result Value Ref Range   HM Mammogram 0-4 Bi-Rad 0-4 Bi-Rad, Self Reported Normal      Assessment & Plan:   Problem List Items Addressed This Visit       Cardiovascular and Mediastinum   Paroxysmal A-fib (HCC)   She has a history of paroxysmal A-fib. 2 weeks ago, she had a 2.5 hour run of a-fib. This is longer than it has been in the past. It ended up breaking on it's own. She reached out to cardiology and is  waiting for a response. Continue aspirin  81 mg daily and metoprolol  12.5 mg twice a day.  Continue collaboration recommendations from cardiology.      Relevant Medications   nitroGLYCERIN  (NITROSTAT ) 0.4 MG SL tablet   Other Relevant Orders   CBC with Differential/Platelet   Comprehensive metabolic panel with GFR   TSH     Respiratory   Sleep apnea   She was recently diagnosed with mild sleep apnea is using his CPAP every night.  Continue using this to help control symptoms along with A-fib.        Endocrine   Thyroid  nodule   Biopsy came back benign.  Will check TSH today      Relevant Orders   TSH     Other   Prediabetes   Chronic, stable. Will check an A1c and treat based on results.      Relevant Orders   Hemoglobin A1c   Hypercholesterolemia   History of elevated cholesterol that is well controlled on rosuvastatin  20 mg daily.  We will check CMP, CBC, lipid panel today.      Relevant Medications   nitroGLYCERIN  (NITROSTAT ) 0.4 MG SL tablet   Other Relevant Orders   CBC with Differential/Platelet  Comprehensive metabolic panel with GFR   Lipid panel   Obesity (BMI 30-39.9)   BMI 34.4. She has lost 20 pounds since her last visit. She is following with a bariatric clinic and using zepbound. Continue collaboration and recommendations from specialist.       Relevant Medications   ZEPBOUND 5 MG/0.5ML Pen   Other Relevant Orders   VITAMIN D 25 Hydroxy (Vit-D Deficiency, Fractures)   Routine general medical examination at a health care facility - Primary   Health maintenance reviewed and updated. Discussed nutrition, exercise. Follow-up 1 year.        Chronic pain of left knee   She completed physical therapy however pain worsened. She has surgery planned for August.       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 She is following with ophthalmology. Check ANA,  ESR, CRP today.       Relevant Orders   ANA w/Reflex   Sedimentation rate   C-reactive protein     Follow up plan: Return in about 1 year (around 08/20/2024) for CPE.   LABORATORY TESTING:  - Pap smear: up to date  IMMUNIZATIONS:   - Tdap: Tetanus vaccination status reviewed: last tetanus booster within 10 years. - Influenza: Postponed to flu season - Pneumovax: Not applicable - Prevnar: Declined - HPV: Not applicable - Shingrix  vaccine: Up to date  SCREENING: -Mammogram: Up to date  - Colonoscopy: Up to date  - Bone Density: Not applicable   PATIENT COUNSELING:   Advised to take 1 mg of folate supplement per day if capable of pregnancy.   Sexuality: Discussed sexually transmitted diseases, partner selection, use of condoms, avoidance of unintended pregnancy  and contraceptive alternatives.   Advised to avoid cigarette smoking.  I discussed with the patient that most people either abstain from alcohol or drink within safe limits (<=14/week and <=4 drinks/occasion for males, <=7/weeks and <= 3 drinks/occasion for females) and that the risk for alcohol disorders and other health effects rises proportionally with the number of drinks per week and how often a drinker exceeds daily limits.  Discussed cessation/primary prevention of drug use and availability of treatment for abuse.   Diet: Encouraged to adjust caloric intake to maintain  or achieve ideal body weight, to reduce intake of dietary saturated fat and total fat, to limit sodium intake by avoiding high sodium foods and not adding table salt, and to maintain adequate dietary potassium and calcium  preferably from fresh fruits, vegetables, and low-fat dairy products.    stressed the importance of regular exercise  Injury prevention: Discussed safety belts, safety helmets, smoke detector, smoking near bedding or upholstery.   Dental health: Discussed importance of regular tooth brushing, flossing, and dental visits.     NEXT PREVENTATIVE PHYSICAL DUE IN 1 YEAR. Return in about 1 year (around 08/20/2024) for CPE.  Kathee Tumlin A Natesha Hassey

## 2023-08-21 NOTE — Assessment & Plan Note (Signed)
 Chronic, stable. Will check an A1c and treat based on results.

## 2023-08-21 NOTE — Assessment & Plan Note (Signed)
Biopsy came back benign.  Will check TSH today

## 2023-08-21 NOTE — Assessment & Plan Note (Signed)
 BMI 34.4. She has lost 20 pounds since her last visit. She is following with a bariatric clinic and using zepbound. Continue collaboration and recommendations from specialist.

## 2023-08-21 NOTE — Telephone Encounter (Signed)
 Recommend mail 14 day ZIO monitor and schedule follow up with Dr. Lonni or APP in 4-6 weeks.   To prevent palpitations: Make sure you are adequately hydrated.  Avoid and/or limit caffeine containing beverages like soda or tea. Exercise regularly.  Manage stress well. Some over the counter medications can cause palpitations such as Benadryl, AdvilPM, TylenolPM. Regular Advil or Tylenol  do not cause palpitations.     Lauralye Kinn S Coila Wardell, NP

## 2023-08-21 NOTE — Assessment & Plan Note (Signed)
Health maintenance reviewed and updated. Discussed nutrition, exercise. Follow-up 1 year.

## 2023-08-23 LAB — ANA W/REFLEX: Anti Nuclear Antibody (ANA): NEGATIVE

## 2023-08-24 ENCOUNTER — Ambulatory Visit: Payer: PRIVATE HEALTH INSURANCE | Attending: Family

## 2023-08-24 DIAGNOSIS — R002 Palpitations: Secondary | ICD-10-CM

## 2023-08-24 NOTE — Progress Notes (Unsigned)
 Enrolled for [employer] to mail a ZIO XT long term holter monitor to the patients address on file.   Dr. Jodelle Red to read.

## 2023-09-08 ENCOUNTER — Encounter: Payer: Self-pay | Admitting: Family Medicine

## 2023-09-08 ENCOUNTER — Ambulatory Visit: Payer: PRIVATE HEALTH INSURANCE | Admitting: Family Medicine

## 2023-09-08 ENCOUNTER — Ambulatory Visit: Payer: Self-pay

## 2023-09-08 ENCOUNTER — Ambulatory Visit: Payer: Self-pay | Admitting: Family Medicine

## 2023-09-08 VITALS — BP 124/78 | HR 59 | Temp 97.1°F | Ht 66.0 in | Wt 209.6 lb

## 2023-09-08 DIAGNOSIS — R1013 Epigastric pain: Secondary | ICD-10-CM | POA: Insufficient documentation

## 2023-09-08 DIAGNOSIS — R109 Unspecified abdominal pain: Secondary | ICD-10-CM | POA: Insufficient documentation

## 2023-09-08 LAB — COMPREHENSIVE METABOLIC PANEL WITH GFR
ALT: 17 U/L (ref 0–35)
AST: 17 U/L (ref 0–37)
Albumin: 4.3 g/dL (ref 3.5–5.2)
Alkaline Phosphatase: 57 U/L (ref 39–117)
BUN: 22 mg/dL (ref 6–23)
CO2: 29 meq/L (ref 19–32)
Calcium: 9.5 mg/dL (ref 8.4–10.5)
Chloride: 103 meq/L (ref 96–112)
Creatinine, Ser: 0.55 mg/dL (ref 0.40–1.20)
GFR: 100.85 mL/min (ref >60.00)
Glucose, Bld: 87 mg/dL (ref 70–99)
Potassium: 4 meq/L (ref 3.5–5.1)
Sodium: 139 meq/L (ref 135–145)
Total Bilirubin: 0.5 mg/dL (ref 0.2–1.2)
Total Protein: 7.1 g/dL (ref 6.0–8.3)

## 2023-09-08 LAB — CBC WITH DIFFERENTIAL/PLATELET
Basophils Absolute: 0 K/uL (ref 0.0–0.1)
Basophils Relative: 0.5 % (ref 0.0–3.0)
Eosinophils Absolute: 0.1 K/uL (ref 0.0–0.7)
Eosinophils Relative: 1.2 % (ref 0.0–5.0)
HCT: 38.3 % (ref 36.0–46.0)
Hemoglobin: 12.9 g/dL (ref 12.0–15.0)
Lymphocytes Relative: 18.9 % (ref 12.0–46.0)
Lymphs Abs: 1.7 K/uL (ref 0.7–4.0)
MCHC: 33.7 g/dL (ref 30.0–36.0)
MCV: 89 fl (ref 78.0–100.0)
Monocytes Absolute: 0.7 K/uL (ref 0.1–1.0)
Monocytes Relative: 7.7 % (ref 3.0–12.0)
Neutro Abs: 6.5 K/uL (ref 1.4–7.7)
Neutrophils Relative %: 71.7 % (ref 43.0–77.0)
Platelets: 246 K/uL (ref 150.0–400.0)
RBC: 4.3 Mil/uL (ref 3.87–5.11)
RDW: 13.5 % (ref 11.5–15.5)
WBC: 9.1 K/uL (ref 4.0–10.5)

## 2023-09-08 LAB — LIPASE: Lipase: 28 U/L (ref 11.0–59.0)

## 2023-09-08 LAB — AMYLASE: Amylase: 41 U/L (ref 27–131)

## 2023-09-08 NOTE — Progress Notes (Signed)
 Established Patient Office Visit   Subjective:  Patient ID: Nicole Anderson, female    DOB: 03-27-1965  Age: 58 y.o. MRN: [id number]  Chief Complaint  Patient presents with   Flank Pain    Left side flank pain x 12 hours. Pt complains of abdominal pain and nausea. Pt has been taking Zepbound for 2 months.     Flank Pain Associated symptoms include abdominal pain. Pertinent negatives include no dysuria, tingling or weakness.   Encounter Diagnoses  Name Primary?   Left flank pain Yes   Epigastric pain    1 to 2-day history of left flank pain accompanied by generalized abdominal pain.  There has been slight nausea without vomiting.  Ongoing constipation.  No fevers or chills.  No history of renal lithiasis.  History of appendectomy, cholecystectomy and exploratory laparotomy in her past.  She has been taking GLP-1 agonist over the last few years but recently started tirzepatide over the last 2 months.  Last dosing was 2 days ago.  Currently wearing Holter monitor with history of atrial fibs.  No recent palpitations.   Review of Systems  Constitutional: Negative.   HENT: Negative.    Eyes:  Negative for blurred vision, discharge and redness.  Respiratory: Negative.    Cardiovascular: Negative.   Gastrointestinal:  Positive for abdominal pain, constipation and vomiting. Negative for blood in stool, diarrhea, heartburn, melena and nausea.  Genitourinary:  Positive for flank pain. Negative for dysuria, frequency, hematuria and urgency.  Musculoskeletal:  Negative for myalgias.  Skin:  Negative for rash.  Neurological:  Negative for tingling, loss of consciousness and weakness.  Endo/Heme/Allergies:  Negative for polydipsia.     Current Outpatient Medications:    aspirin  EC 81 MG tablet, Take 1 tablet (81 mg total) by mouth daily. Swallow whole., Disp: 90 tablet, Rfl: 3   metoprolol  tartrate (LOPRESSOR ) 25 MG tablet, Take A HALF tablet (12.5mg  total) by mouth 2 (two) times daily.,  Disp: 90 tablet, Rfl: 2   nitroGLYCERIN  (NITROSTAT ) 0.4 MG SL tablet, Place 1 tablet (0.4 mg total) under the tongue every 5 (five) minutes as needed for chest pain., Disp: 30 tablet, Rfl: 0   rosuvastatin  (CRESTOR ) 20 MG tablet, Take 1 tablet (20 mg total) by mouth daily., Disp: 90 tablet, Rfl: 0   ZEPBOUND 5 MG/0.5ML Pen, Inject 5 mg into the skin once a week., Disp: , Rfl:    diclofenac (VOLTAREN) 75 MG EC tablet, Take 75 mg by mouth 2 (two) times daily. (Patient not taking: Reported on 09/08/2023), Disp: , Rfl:    Objective:     BP 124/78 (BP Location: Right Arm, Patient Position: Sitting, Cuff Size: Large)   Pulse (!) 59   Temp (!) 97.1 F (36.2 C) (Temporal)   Ht 5' 6 (1.676 m)   Wt 209 lb 9.6 oz (95.1 kg)   SpO2 99%   BMI 33.83 kg/m  Wt Readings from Last 3 Encounters:  09/08/23 209 lb 9.6 oz (95.1 kg)  08/21/23 213 lb 3.2 oz (96.7 kg)  04/20/23 233 lb 3.2 oz (105.8 kg)      Physical Exam Constitutional:      General: She is not in acute distress.    Appearance: Normal appearance. She is not ill-appearing, toxic-appearing or diaphoretic.  HENT:     Head: Normocephalic and atraumatic.     Right Ear: External ear normal.     Left Ear: External ear normal.     Mouth/Throat:     Mouth:  Mucous membranes are moist.     Pharynx: Oropharynx is clear. No oropharyngeal exudate or posterior oropharyngeal erythema.  Eyes:     General: No scleral icterus.       Right eye: No discharge.        Left eye: No discharge.     Extraocular Movements: Extraocular movements intact.     Conjunctiva/sclera: Conjunctivae normal.     Pupils: Pupils are equal, round, and reactive to light.  Cardiovascular:     Rate and Rhythm: Normal rate and regular rhythm.  Pulmonary:     Effort: Pulmonary effort is normal. No respiratory distress.     Breath sounds: Normal breath sounds.  Abdominal:     General: Bowel sounds are normal.     Tenderness: There is no abdominal tenderness (Mild  midepigastric tenderness to palpation). There is no right CVA tenderness, left CVA tenderness or guarding.  Musculoskeletal:     Cervical back: No rigidity or tenderness.  Skin:    General: Skin is warm and dry.  Neurological:     Mental Status: She is alert and oriented to person, place, and time.  Psychiatric:        Mood and Affect: Mood normal.        Behavior: Behavior normal.      No results found for any visits on 09/08/23.    The ASCVD Risk score (Arnett DK, et al., 2019) failed to calculate for the following reasons:   The valid total cholesterol range is 130 to 320 mg/dL    Assessment & Plan:   Left flank pain -     CBC with Differential/Platelet -     Comprehensive metabolic panel with GFR  Epigastric pain -     Amylase -     CBC with Differential/Platelet -     Comprehensive metabolic panel with GFR -     Lipase    Return To emergency room if worse..  Follow-up up in 1 week if not improving or to emergency room if worse.  Hold tirzepatide until results are in for above workup.  Abdominal exam was benign today.  Elsie Sim Lent, MD

## 2023-09-08 NOTE — Progress Notes (Signed)
 Established Patient Office Visit   Subjective:  Patient ID: Nicole Anderson, female    DOB: 1965-05-07  Age: 58 y.o. MRN: [id number]  Chief Complaint  Patient presents with   Flank Pain    Left side flank pain x 12 hours. Pt complains of abdominal pain and nausea. Pt has been taking Zepbound for 2 months.     Flank Pain Associated symptoms include abdominal pain. Pertinent negatives include no dysuria, tingling or weakness.   Encounter Diagnoses  Name Primary?   Left flank pain Yes   Epigastric pain    1 to 2-day history of left flank pain accompanied by generalized abdominal pain.  There has been slight nausea without vomiting.  Ongoing constipation.  No fevers or chills.  No history of renal lithiasis.  History of appendectomy, cholecystectomy and exploratory laparotomy in her past.  She has been taking GLP-1 agonist over the last few years but recently started tirzepatide over the last 2 months.  Last dosing was 2 days ago.  Currently wearing Holter monitor with history of atrial fibs.  No recent palpitations.   Review of Systems  Constitutional: Negative.   HENT: Negative.    Eyes:  Negative for blurred vision, discharge and redness.  Respiratory: Negative.    Cardiovascular: Negative.   Gastrointestinal:  Positive for abdominal pain, constipation and vomiting. Negative for blood in stool, diarrhea, heartburn, melena and nausea.  Genitourinary:  Positive for flank pain. Negative for dysuria, frequency, hematuria and urgency.  Musculoskeletal:  Negative for myalgias.  Skin:  Negative for rash.  Neurological:  Negative for tingling, loss of consciousness and weakness.  Endo/Heme/Allergies:  Negative for polydipsia.     Current Outpatient Medications:    aspirin  EC 81 MG tablet, Take 1 tablet (81 mg total) by mouth daily. Swallow whole., Disp: 90 tablet, Rfl: 3   metoprolol  tartrate (LOPRESSOR ) 25 MG tablet, Take A HALF tablet (12.5mg  total) by mouth 2 (two) times daily.,  Disp: 90 tablet, Rfl: 2   nitroGLYCERIN  (NITROSTAT ) 0.4 MG SL tablet, Place 1 tablet (0.4 mg total) under the tongue every 5 (five) minutes as needed for chest pain., Disp: 30 tablet, Rfl: 0   rosuvastatin  (CRESTOR ) 20 MG tablet, Take 1 tablet (20 mg total) by mouth daily., Disp: 90 tablet, Rfl: 0   ZEPBOUND 5 MG/0.5ML Pen, Inject 5 mg into the skin once a week., Disp: , Rfl:    diclofenac (VOLTAREN) 75 MG EC tablet, Take 75 mg by mouth 2 (two) times daily. (Patient not taking: Reported on 09/08/2023), Disp: , Rfl:    Objective:     BP 124/78 (BP Location: Right Arm, Patient Position: Sitting, Cuff Size: Large)   Pulse (!) 59   Temp (!) 97.1 F (36.2 C) (Temporal)   Ht 5' 6 (1.676 m)   Wt 209 lb 9.6 oz (95.1 kg)   SpO2 99%   BMI 33.83 kg/m  Wt Readings from Last 3 Encounters:  09/08/23 209 lb 9.6 oz (95.1 kg)  08/21/23 213 lb 3.2 oz (96.7 kg)  04/20/23 233 lb 3.2 oz (105.8 kg)      Physical Exam Constitutional:      General: She is not in acute distress.    Appearance: Normal appearance. She is not ill-appearing, toxic-appearing or diaphoretic.  HENT:     Head: Normocephalic and atraumatic.     Right Ear: External ear normal.     Left Ear: External ear normal.     Mouth/Throat:     Mouth:  Mucous membranes are moist.     Pharynx: Oropharynx is clear. No oropharyngeal exudate or posterior oropharyngeal erythema.  Eyes:     General: No scleral icterus.       Right eye: No discharge.        Left eye: No discharge.     Extraocular Movements: Extraocular movements intact.     Conjunctiva/sclera: Conjunctivae normal.     Pupils: Pupils are equal, round, and reactive to light.  Cardiovascular:     Rate and Rhythm: Normal rate and regular rhythm.  Pulmonary:     Effort: Pulmonary effort is normal. No respiratory distress.     Breath sounds: Normal breath sounds.  Abdominal:     General: Bowel sounds are normal.     Tenderness: There is no abdominal tenderness (Mild  midepigastric tenderness to palpation). There is no right CVA tenderness, left CVA tenderness or guarding.  Musculoskeletal:     Cervical back: No rigidity or tenderness.  Skin:    General: Skin is warm and dry.  Neurological:     Mental Status: She is alert and oriented to person, place, and time.  Psychiatric:        Mood and Affect: Mood normal.        Behavior: Behavior normal.      No results found for any visits on 09/08/23.    The ASCVD Risk score (Arnett DK, et al., 2019) failed to calculate for the following reasons:   The valid total cholesterol range is 130 to 320 mg/dL    Assessment & Plan:   Left flank pain -     CBC with Differential/Platelet -     Comprehensive metabolic panel with GFR  Epigastric pain -     Amylase -     CBC with Differential/Platelet -     Comprehensive metabolic panel with GFR -     Lipase    Return To emergency room if worse..  Follow-up up in 1 week if not improving or to emergency room if worse.  Hold tirzepatide until results are in for above workup.  Abdominal exam was benign today.  Elsie Sim Lent, MD

## 2023-09-08 NOTE — Telephone Encounter (Signed)
 FYI Only or Action Required?: FYI only for provider.  Patient was last seen in primary care on 08/21/2023 by Nedra Tinnie LABOR, NP.  Called Nurse Triage reporting Back Pain.  Symptoms began yesterday.  Interventions attempted: Rest, hydration, or home remedies.  Symptoms are: gradually worsening.  Triage Disposition: See Physician Within 24 Hours  Patient/caregiver understands and will follow disposition?: Yes    Copied from CRM #[id number]. Topic: Clinical - Red Word Triage >> Sep 08, 2023  8:11 AM [place] E wrote: [facility] that prompted transfer to Nurse Triage: Left flank pain, started around midnight this morning. Reason for Disposition  MODERATE pain (e.g., interferes with normal activities or awakens from sleep)  Answer Assessment - Initial Assessment Questions No changes to nature of bowel movements or urine. No fever. No burning with urination/UTI symptoms. No hx of kidney disease.    1. LOCATION: Where does it hurt? (e.g., left, right)     Left side in flank area 2. ONSET: When did the pain start?     Around midnight - woke patient up from sleep 3. SEVERITY: How bad is the pain? (e.g., Scale 1-10; mild, moderate, or severe)     Dull ache - moderate and constant. States it does not feel muscular 4. PATTERN: Does the pain come and go, or is it constant?      Constant 5. CAUSE: What do you think is causing the pain?     Patient states she is concerned because she is on Zepbound. Last injection on Sunday.  6. OTHER SYMPTOMS:  Do you have any other symptoms? (e.g., fever, abdomen pain, vomiting, leg weakness, burning with urination, blood in urine)     Patient states yesterday she had abdominal pain (felt like stomach pains like she had to go to the restroom)  Protocols used: Flank Pain-A-AH

## 2023-10-05 ENCOUNTER — Encounter (HOSPITAL_BASED_OUTPATIENT_CLINIC_OR_DEPARTMENT_OTHER): Payer: Self-pay | Admitting: Family

## 2023-10-05 ENCOUNTER — Ambulatory Visit (HOSPITAL_BASED_OUTPATIENT_CLINIC_OR_DEPARTMENT_OTHER): Payer: PRIVATE HEALTH INSURANCE | Admitting: Family

## 2023-10-05 VITALS — BP 130/84 | HR 58 | Ht 65.0 in | Wt 205.0 lb

## 2023-10-05 DIAGNOSIS — E785 Hyperlipidemia, unspecified: Secondary | ICD-10-CM

## 2023-10-05 DIAGNOSIS — I2510 Atherosclerotic heart disease of native coronary artery without angina pectoris: Secondary | ICD-10-CM | POA: Diagnosis not present

## 2023-10-05 DIAGNOSIS — I471 Supraventricular tachycardia, unspecified: Secondary | ICD-10-CM | POA: Diagnosis not present

## 2023-10-05 DIAGNOSIS — I480 Paroxysmal atrial fibrillation: Secondary | ICD-10-CM | POA: Diagnosis not present

## 2023-10-05 MED ORDER — METOPROLOL TARTRATE 25 MG PO TABS: 12.5000 mg | ORAL_TABLET | Freq: Two times a day (BID) | ORAL | 3 refills | Status: AC

## 2023-10-05 MED ORDER — ROSUVASTATIN CALCIUM 20 MG PO TABS: 20.0000 mg | ORAL_TABLET | Freq: Every day | ORAL | 3 refills | Status: AC

## 2023-10-05 MED ORDER — DILTIAZEM HCL 30 MG PO TABS: ORAL_TABLET | ORAL | 1 refills | Status: AC

## 2023-10-05 NOTE — Progress Notes (Unsigned)
  Cardiology Office Note   Date:  10/05/2023  ID:  Nicole Anderson, DOB 01/18/1966, MRN [id number] PCP: Nedra Tinnie LABOR, NP  [facility] Providers Cardiologist:  Shelda Bruckner, MD Sleep Medicine:  Wilbert Bihari, MD { Click to update primary MD,subspecialty MD or APP then REFRESH:1}    History of Present Illness Nicole Anderson is a 58 y.o. female with hx of atrial fibrillation, SVT, nonobstructive CAD, OSA, HLD, preeclampsia  Presented to ED 09/10/20 with afib RVR. CCTA 08/2020 calcium  score 154, nonobstructive diease in prox LAD.  Monitor with predominantly NSR. 3 runs of SVT fastest/longest 13 beats max 182 bpm avg 154 bpm. One of these 3 SVT episodes associated with triggered event. Average heart rate of 62 bpm. No atrial fibrillation.   Last seen 11/12/22 with intermittent palpitations. OAC deferred with CHADSVASc 2 as one of the points was for gender.   PResents today for follow up. She is 2 weeks post arthroscopy knee surgery. Will have arrhythmias with sensation of lightheadedness needing to sit down.   Works at [employer].   Lugenia January and july  ROS: Please see the history of present illness.    All other systems reviewed and are negative.   Studies Reviewed EKG Interpretation Date/Time:  Monday October 05 2023 14:50:45 EDT Ventricular Rate:  58 PR Interval:  148 QRS Duration:  84 QT Interval:  408 QTC Calculation: 400 R Axis:   -6  Text Interpretation: Sinus bradycardia Low voltage QRS Confirmed by Vannie Mora ([id number]) on 10/05/2023 3:10:59 PM    *** Risk Assessment/Calculations  CHA2DS2-VASc Score = 2  {Confirm score is correct.  If not, click here to update score.  REFRESH note.  :1} This indicates a 2.2% annual risk of stroke. The patient's score is based upon: CHF History: 0 HTN History: 0 Diabetes History: 0 Stroke History: 0 Vascular Disease History: 1 Age Score: 0 Gender Score: 1         STOP-Bang Score:     { Consider Dx  Sleep Disordered Breathing or Sleep Apnea  ICD G47.33          :1}    Physical Exam VS:  BP 130/84   Pulse (!) 58   Ht 5' 5 (1.651 m)   Wt 205 lb (93 kg)   SpO2 99%   BMI 34.11 kg/m        Wt Readings from Last 3 Encounters:  10/05/23 205 lb (93 kg)  09/08/23 209 lb 9.6 oz (95.1 kg)  08/21/23 213 lb 3.2 oz (96.7 kg)    GEN: Well nourished, well developed in no acute distress NECK: No JVD; No carotid bruits CARDIAC: RRR, no murmurs, rubs, gallops RESPIRATORY:  Clear to auscultation without rales, wheezing or rhonchi  ABDOMEN: Soft, non-tender, non-distended EXTREMITIES:  No edema; No deformity   ASSESSMENT AND PLAN  Nonobstructive CAD / HLD, LDL goal <70 - Stable with no anginal symptoms. No indication for ischemic evaluation.   07/2023 LDL 45. Continue Rosuvastatin  20mg  daily.   OSA - CPAP compliance encouraged. Endorses wearing regularly. Working to get new mask that fits better after weight loss.   SVT / PAF - Avoids caffeine, alcohol.  Discussed possible loop recorder ***        Dispo: follow up in 1 year with Dr. Bruckner  Signed, Mora GORMAN Vannie, NP

## 2023-10-05 NOTE — Patient Instructions (Signed)
 Medication Instructions:   START Diltiazem  20mg  as needed for atrial fibrillation not responsive to Metoprolol   Continue Metoprolol  12.5mg  half tablet twice per day  If you have an episode of atrial fibrillation: Take extra half tablet Metoprolol  12.5mg  If you are still having atrial fibrillation after 30 minutes, take Diltiazem  30mg   *If you need a refill on your cardiac medications before your next appointment, please call your pharmacy*  Follow-Up: At [facility], you and your health needs are our priority.  As part of our continuing mission to provide you with exceptional heart care, our providers are all part of one team.  This team includes your primary Cardiologist (physician) and Advanced Practice Providers or APPs (Physician Assistants and Nurse Practitioners) who all work together to provide you with the care you need, when you need it.  Your next appointment:   1 year(s)  Provider:   Shelda Bruckner, MD, Rosaline Bane, NP, or Reche Finder, NP   We recommend signing up for the patient portal called MyChart.  Sign up information is provided on this After Visit Summary.  MyChart is used to connect with patients for Virtual Visits (Telemedicine).  Patients are able to view lab/test results, encounter notes, upcoming appointments, etc.  Non-urgent messages can be sent to your provider as well.   To learn more about what you can do with MyChart, go to [email or web address].   Other Instructions      Monitor result: Monitor with predominantly NSR. 3 runs of SVT fastest/longest 13 beats max 182 bpm avg 154 bpm. One of these 3 SVT episodes associated with triggered event. Average heart rate of 62 bpm. No atrial fibrillation.

## 2023-11-04 DIAGNOSIS — R002 Palpitations: Secondary | ICD-10-CM

## 2023-11-05 ENCOUNTER — Ambulatory Visit (HOSPITAL_BASED_OUTPATIENT_CLINIC_OR_DEPARTMENT_OTHER): Payer: Self-pay | Admitting: Family
